# Patient Record
Sex: Female | Born: 1945 | Hispanic: No
Health system: Northeastern US, Academic
[De-identification: ages and names within clinical notes are randomized; demographics above are authoritative.]

---

## 2019-10-03 ENCOUNTER — Ambulatory Visit

## 2019-11-08 ENCOUNTER — Ambulatory Visit: Admit: 2019-11-08 | Payer: Medicare PPO

## 2019-11-08 ENCOUNTER — Ambulatory Visit: Admitting: Internal Medicine

## 2019-11-08 LAB — HX CHEM-PANELS
HX ANION GAP: 6 (ref 3–14)
HX BLOOD UREA NITROGEN: 12 mg/dL (ref 6–24)
HX CHLORIDE (CL): 106 meq/L (ref 98–110)
HX CO2: 26 meq/L (ref 20–30)
HX CREATININE (CR): 0.89 mg/dL (ref 0.57–1.30)
HX GFR, AFRICAN AMERICAN: 74 mL/min/{1.73_m2}
HX GFR, NON-AFRICAN AMERICAN: 64 mL/min/{1.73_m2}
HX GLUCOSE: 128 mg/dL (ref 70–139)
HX POTASSIUM (K): 3.9 meq/L (ref 3.6–5.1)
HX SODIUM (NA): 138 meq/L (ref 135–145)

## 2019-11-08 LAB — HX HEM-ROUTINE
HX BASO #: 0.1 10*3/uL (ref 0.0–0.2)
HX BASO: 1 %
HX EOSIN #: 0.2 10*3/uL (ref 0.0–0.5)
HX EOSIN: 2 %
HX HCT: 41.6 % (ref 32.0–45.0)
HX HGB: 13.5 g/dL (ref 11.0–15.0)
HX IMMATURE GRANULOCYTE#: 0 10*3/uL (ref 0.0–0.1)
HX IMMATURE GRANULOCYTE: 0 %
HX LYMPH #: 2 10*3/uL (ref 1.0–4.0)
HX LYMPH: 26 %
HX MCH: 30.2 pg (ref 26.0–34.0)
HX MCHC: 32.5 g/dL (ref 32.0–36.0)
HX MCV: 93.1 fL (ref 80.0–98.0)
HX MONO #: 0.7 10*3/uL (ref 0.2–0.8)
HX MONO: 9 %
HX MPV: 10.8 fL (ref 9.1–11.7)
HX NEUT #: 4.6 10*3/uL (ref 1.5–7.5)
HX NRBC #: 0 10*3/uL
HX NUCLEATED RBC: 0 %
HX PLT: 271 10*3/uL (ref 150–400)
HX RBC BLOOD COUNT: 4.47 M/uL (ref 3.70–5.00)
HX RDW: 13 % (ref 11.5–14.5)
HX SEG NEUT: 61 %
HX WBC: 7.6 10*3/uL (ref 4.0–11.0)

## 2019-11-08 LAB — HX DIABETES: HX GLUCOSE: 128 mg/dL (ref 70–139)

## 2019-11-08 LAB — HX CHEM-LFT
HX ALANINE AMINOTRANSFERASE (ALT/SGPT): 29 IU/L (ref 0–54)
HX ALKALINE PHOSPHATASE (ALK): 98 IU/L (ref 40–130)
HX ASPARTATE AMINOTRANFERASE (AST/SGOT): 25 IU/L (ref 10–42)
HX BILIRUBIN, TOTAL: 0.6 mg/dL (ref 0.2–1.1)

## 2019-11-08 LAB — HX CHEM-OTHER
HX ALBUMIN: 4 g/dL (ref 3.4–4.8)
HX CALCIUM (CA): 9.1 mg/dL (ref 8.5–10.5)
HX PROTEIN, TOTAL: 6.4 g/dL (ref 6.0–8.3)

## 2019-11-08 LAB — HX COAGULATION
HX INR PT: 1 (ref 0.9–1.3)
HX PROTHROMBIN TIME: 11.3 s (ref 9.7–14.0)

## 2019-11-08 NOTE — Progress Notes (Signed)
 * * *    Brabant, Tylia **DOB:** 05-03-1946 (73 yo F) **Acc No.** [id number] **DOS:**  11/08/2019    ---       [name], [name]**    ------    73 Y old Female, DOB: [birth year], External MRN: [id number]    Account Number: [id number]    [address] [place], [id number]    Home: [phone]    Insurance: [address] [place] [employer]    PCP: Margretta Sidle, DO Referring: Margretta Sidle, DO External Visit  ID: [id number]    Appointment Facility: Pulmonology        * * *    11/08/2019 Progress Notes: Beola Cord, MD **CHN#:** [phone]    ------    ---       **Current Medications**    ---    Taking    * Baclofen 20 MG 20 MG Tablet as directed by mouth     ---    * Metoprolol Succinate 50 MG Capsule ER 24 Hour Sprinkle 1 capsule Orally Once a day    ---    * Omeprazole 20 MG Capsule Delayed Release 1 capsule 30 minutes before morning meal Orally Once a day    ---     Past Medical History    ---      HTN.        ---    GERD.        ---    Chronic musculoskeletal pain.        ---    Lower right leg pain.        ---    Peripheral edema.        ---    SOB.        ---      **Surgical History**    ---      Denies Past Surgical History    ---      **Family History**    ---      Sister: Rheumatoid arthritis.    ---      **Allergies**    ---      N.K.D.A.    ---    Forrestine Him Verified]      **Hospitalization/Major Diagnostic Procedure**    ---      Denies Past Hospitalization    ---      **Review of Systems**    ---     _ADULT Pulmonary_ :    Constitutional Normal, no fever, chills, night sweats. Eyes Normal. HENT  Normal. Respiratory as per HPI, Shortness of breath, Swollen legs or feet.  Cardiovascular Normal, no chest pain, palpitations. Gastrointestinal Normal.  Genitourinary Normal. Musculoskeletal Normal. Neurological Normal.  Psychological Normal. Endocrine Normal. Skin Normal. PH Medication side  effects _n/a_.    rest ROS neg or noncontributory.       **Reason for Appointment**    ---       1\. PULMONARY HYPERTENSION/PFT    ---      **History of Present Illness**    ---     _Pulmonary Hypertension_ :    New referral for ?PHTN    Ms. Ennen is 73Y Female referred to [place] for suspected PHTN. She becomes SOB  walking a street block, climbing a flight of stairs (needs to recover at the  top), carrying things, putting shoes or socks on, and talking on the phone.  Does not become SOB getting dressed and showering. For the last 6 months she  has noticed she  has become more fatigued and her daughter has noticed she has  become less active. Denies DVT/ PE, liver disease. She has taken weight loss  medications (no amphetamines. OTC meds only). Sister has RA (Not twins).    Ms. Learn has OSA (CPAP 5-7 hours nightly for the last couple of months).  Sleeping has gotten better but her ability to be active has not improved. She  is taking HTN medications,    Former smoker 35 years ago (1 pack a day at 56 for 20 years). Experiences  swelling in feet and legs also has some moments of dizziness    Denies DM, CAD, A-fib, HIV, IVDU, cocaine and meth use.    Daughter: [name] Phone number: [phone]    Denies fever, cough or exposure to COVID-19    Denies syncope.    No new or changes in medications    We are going to evaluate her for PHTN with RHC    Data    Apparently has ECHO s/o PHTN (we do not have report - just notation in  records)    I, Angelique Holm, acted as a Neurosurgeon for Dr. Pattricia Boss. Signature  below:    Angelique Holm, Scribe, 11/08/2019 1:30 PM.      **Vital Signs**    ---    Pain scale 0, Ht-in 68, Wt-lbs 218, BMI 33.14, BP 131/74, HR 68, RR 20, Temp  97.6, Oxygen sat % 98RA.      **Examination**    ---     _Pulmonary:_    General Appearance:  alert and oriented x 3, no apparent distress, obese.    Oropharynx:  Normal.    Nasal Mucosa:  Normal.    Nasal Septum:  Normal.    Neck: flat JVP .    Chest Inspection:  Normal.    Breath sounds:  Normal, without wheezes, rhonchi or rales.     Respiratory Effort:  No use of accessory muscles.    Heart Exam:  normal S1/S2 progression, Regular rate and rhythm.    Gastrointestinal:  normal abdomen, nontender.    Extremities:  No cyanosis, no clubbing.    Lymphatic:  No, cervical adenopathy, supraclavicular adenopathy.    Peripheral Vascular System:  Trace edema both legs.    Musculoskeletal:  muscle strength and tone normal.    Skin:  Normal without rash or lesions.    Catheter site:  n/a.         **Assessments**    ---    1\. 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 Essential hypertension - I10    ---    3\. SOB (shortness of breath) - R06.02    ---    4\. 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 Obstructive sleep apnea (adult) (pediatric) - G47.33    ---     SOB (?PHTN): Do not have all the reports/data but assuming RHC were to  demonstrate PHTN - likely etiologies would be diastolic dysfunction (multiple  risk factors) or OSA.    However, needs RHC to differentiate and eliminate other etiologies    Of note, NT-proBNP >1000    Discussed with patient.    ---      **Treatment**    ---      **1\. Pulmonary hypertension**    _LAB: Comp. Metabolic Panel (CMP; 80053)_   Value Reference Range    ---------    Albumin  4.0  3.4 - 4.8 - g/dL    Alkaline Phosphatase (ALK) 98  40 - 130 - IU/L    ------------    Anion Gap 6  3 - 14 -    ------------    Bilirubin, Total 0.6  0.2 - 1.1 - mg/dL    ------------    Calcium (Ca) 9.1  8.5 - 10.5 - mg/dL    ------------    Chloride (CL) 106  98 - 110 - mEq/L    ------------    CO2 26  20 - 30 - mEq/L    ------------    Creatinine (CR) 0.89  0.57 - 1.30 - mg/dL    ------------    Glucose 128  70 - 139 - mg/dL    ------------    Potassium (K) 3.9  3.6 - 5.1 - mEq/L    ------------    Protein, Total 6.4  6.0 - 8.3 - g/dL    ------------    Aspartate Aminotranferase (AST/SGOT) 25  10 - 42 - IU/L     ------------    Alanine Aminotransferase (ALT/SGPT) 29  0 - 54 - IU/L    ------------    Sodium (NA) 138  135 - 145 - mEq/L    ------------    Blood Urea Nitrogen 12  6 - 24 - mg/dL    ------------    _LAB: PT Prothrombin time/INR (PT)_  Value Reference Range    ---------    PT 11.3  9.7 - 14.0 - sec    INR 1.0  0.9 - 1.3 -    ------------    _LAB: CBC/DIFF with PLT (CBCWD)_  Value Reference Range    ---------    WBC 7.6  4.0 - 11.0 - K/uL    RBC 4.47  3.70 - 5.00 - M/uL    ------------    HGB 13.5  11.0 - 15.0 - g/dL    ------------    HCT 41.6  32.0 - 45.0 - %    ------------    MCV 93.1  80.0 - 98.0 - fL    ------------    MCH 30.2  26.0 - 34.0 - pg    ------------    MCHC 32.5  32.0 - 36.0 - g/dL    ------------    RDW 13.0  11.5 - 14.5 - %    ------------    PLT 271  150 - 400 - K/uL    ------------    MPV 10.8  9.1 - 11.7 - fL    ------------    SEG NEUT 61   \- %    ------------    LYMPH 26   \- %    ------------    MONO 9   \- %    ------------    EOS 2   \- %    ------------    BASO 1   \- %    ------------    NEUT # 4.6  1.5 - 7.5 - K/uL    ------------    LYMPH # 2.0  1.0 - 4.0 - K/uL    ------------    MONO # 0.7  0.2 - 0.8 - K/uL    ------------    EOSIN # 0.2  0.0 - 0.5 - K/uL    ------------    BASO # 0.1  0.0 - 0.2 - K/uL    ------------    Imm Grnas 0   \- %    ------------    NRBC  0   \- %    ------------    Imm Grans, Abs 0.0  0.0 - 0.1 - K/uL    ------------    NRBC, Abs 0.0  <0.0 - K/uL    ------------    _LAB: Pro BNP_  Value Reference Range    ---------    Pro BNP 1093 H <125 - pg/mL      **Follow Up**    ---    after RHC    Electronically signed by Simeon Craft on 11/13/2019 at 01:55 PM EST    Sign off status: Completed        * * *        Pulmonology    [address]    [place], [address]     [place], [place] [address]    Tel: [phone]    Fax: [phone]              * * *          Progress Note: Beola Cord, MD 11/08/2019    ---    Note generated by eClinicalWorks EMR/PM Software ([email or web address])

## 2019-11-08 NOTE — Progress Notes (Signed)
.  Progress Notes  .  Patient: Alexis [name]  Provider: Simeon Craft    .  DOB: January 31, 1946 Age: 73 Y Sex: Female  .  PCP: Margretta Sidle DO  Date: 11/08/2019  .  --------------------------------------------------------------------------------  .  REASON FOR APPOINTMENT  .  1. PULMONARY HYPERTENSION/PFT  .  HISTORY OF PRESENT ILLNESS  .  Pulmonary Hypertension:   New referral for ?PHTNMs. Heckart is 36Y Female referred to [place] for  suspected PHTN. She becomes SOB walking a street block, climbing  a flight of stairs (needs to recover at the top), carrying  things, putting shoes or socks on, and talking on the phone. Does  not become SOB getting dressed and showering. For the last 6  months she has noticed she has become more fatigued and her  daughter has noticed she has become less active. Denies DVT/ PE,  liver disease. She has taken weight loss medications (no  amphetamines. OTC meds only). Sister has RA (Not twins). Ms.  Carreno has OSA (CPAP 5-7 hours nightly for the last couple of  months). Sleeping has gotten better but her ability to be active  has not improved. She is taking HTN medications, Former smoker 35  years ago (1 pack a day at 46 for 20 years). Experiences swelling  in feet and legs also has some moments of dizzinessDenies DM,  CAD, A-fib, HIV, IVDU, cocaine and meth use.Daughter: Alexis [name]  Phone number: [phone] fever, cough or exposure to  COVID-19Denies syncope. No new or changes in medicationsWe are  going to evaluate her for PHTN with RHCDataApparently has ECHO  s/o PHTN (we do not have report - just notation in records)I,  Angelique Holm, acted as a Neurosurgeon for Dr. Pattricia Boss.  Signature below: Angelique Holm, Scribe, 11/08/2019  1:30 PM.  .  CURRENT MEDICATIONS  .  Taking Baclofen 20 MG 20 MG Tablet as directed by mouth  Taking Metoprolol Succinate 50 MG Capsule ER 24 Hour Sprinkle 1  capsule Orally Once a day  Taking Omeprazole 20 MG Capsule Delayed Release 1  capsule 30  minutes before morning meal Orally Once a day  .  PAST MEDICAL HISTORY  .  HTN  GERD  Chronic musculoskeletal pain  Lower right leg pain  Peripheral edema  SOB  .  ALLERGIES  .  N.K.D.A.  .  SURGICAL HISTORY  .  Denies Past Surgical History  .  FAMILY HISTORY  .  Sister: Rheumatoid arthritis.  [name]  HOSPITALIZATION/MAJOR DIAGNOSTIC PROCEDURE  .  Denies Past Hospitalization  .  REVIEW OF SYSTEMS  .  ADULT Pulmonary:  .  Constitutional    Normal, no fever, chills, night sweats . Eyes     Normal . HENT    Normal . Respiratory    as per HPI, Shortness  of breath, Swollen legs or feet . Cardiovascular    Normal, no  chest pain, palpitations . Gastrointestinal    Normal .  Genitourinary    Normal . Musculoskeletal    Normal .  Neurological    Normal . Psychological    Normal . Endocrine     Normal . Skin    Normal . PH Medication side effects    n/a .  [name]  rest ROS neg or noncontributory.  [name]  VITAL SIGNS  .  Pain scale 0, Ht-in 68, Wt-lbs 218, BMI 33.14, BP 131/74, HR 68,  RR 20, Temp 97.6, Oxygen sat % 98RA.  [name]  EXAMINATION  .  Pulmonary:  General Appearance: alert and oriented x 3, no apparent distress,  obese.  .  Oropharynx: Normal.  .  Nasal Mucosa: Normal.  .  Nasal Septum: Normal.  .  Neck:flat JVP .  [name]  Chest Inspection: Normal.  .  Breath sounds: Normal, without wheezes, rhonchi or rales.  [name]  Respiratory Effort: No use of accessory muscles.  .  Heart Exam: normal S1/S2 progression, Regular rate and rhythm.  .  Gastrointestinal: normal abdomen, nontender.  .  Extremities: No cyanosis, no clubbing.  [name]  Lymphatic: No, cervical adenopathy, supraclavicular adenopathy.  .  Peripheral Vascular System: Trace edema both legs.  .  Musculoskeletal: muscle strength and tone normal.  .  Skin: Normal without rash or lesions.  .  Catheter site: n/a.  .  ASSESSMENTS  .  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  Essential hypertension - I10  .  SOB (shortness of breath) - R06.02  .  Dependence on other enabling  machines and devices - Z99.89  .  Obstructive sleep apnea (adult) (pediatric) - G47.33  .  SOB (?PHTN): Do not have all the reports/data but assuming RHC  were to demonstrate PHTN - likely etiologies would be diastolic  dysfunction (multiple risk factors) or OSA.However, needs RHC to  differentiate and eliminate other etiologiesOf note, NT-proBNP  >1000Discussed with patient.  .  TREATMENT  .  Pulmonary hypertension  LAB: Comp. Metabolic Panel (CMP; 80053)  Albumin     4.0     (3.4 - 4.8 - g/dL)  Alkaline Phosphatase (ALK)     98     (40 - 130 - IU/L)  Anion Gap     6     (3 - 14 - )  Bilirubin, Total     0.6     (0.2 - 1.1 - mg/dL)  Calcium (Ca)     9.1     (8.5 - 10.5 - mg/dL)  Chloride (CL)     440     (98 - 110 - mEq/L)  CO2     26     (20 - 30 - mEq/L)  Creatinine (CR)     0.89     (0.57 - 1.30 - mg/dL)  Glucose     102     (70 - 139 - mg/dL)  Potassium (K)     3.9     (3.6 - 5.1 - mEq/L)  Protein, Total     6.4     (6.0 - 8.3 - g/dL)  Aspartate Aminotranferase (AST/SGOT)     25     (10 - 42 - IU/L)  Alanine Aminotransferase (ALT/SGPT)     29     (0 - 54 - IU/L)  Sodium (NA)     138     (135 - 145 - mEq/L)  Blood Urea Nitrogen     12     (6 - 24 - mg/dL)  .  [name]  LAB: PT Prothrombin time/INR (PT)  PT     11.3     (9.7 - 14.0 - sec)  INR     1.0     (0.9 - 1.3 - )  .  [name]  LAB: CBC/DIFF with PLT (CBCWD)  WBC     7.6     (4.0 - 11.0 - K/uL)  RBC     4.47     (3.70 - 5.00 - M/uL)  HGB     13.5     (11.0 -  15.0 - g/dL)  HCT     77.9     (39.6 - 45.0 - %)  MCV     93.1     (80.0 - 98.0 - fL)  MCH     30.2     (26.0 - 34.0 - pg)  MCHC     32.5     (32.0 - 36.0 - g/dL)  RDW     88.6     (48.4 - 14.5 - %)  PLT     271     (150 - 400 - K/uL)  MPV     10.8     (9.1 - 11.7 - fL)  SEG NEUT     61     ( - %)  LYMPH     26     ( - %)  MONO     9     ( - %)  EOS     2     ( - %)  BASO     1     ( - %)  NEUT #     4.6     (1.5 - 7.5 - K/uL)  LYMPH #     2.0     (1.0 - 4.0 - K/uL)  MONO #     0.7     (0.2 - 0.8 - K/uL)  EOSIN #      0.2     (0.0 - 0.5 - K/uL)  BASO #     0.1     (0.0 - 0.2 - K/uL)  Imm Grnas     0     ( - %)  NRBC     0     ( - %)  Imm Grans, Abs     0.0     (0.0 - 0.1 - K/uL)  NRBC, Abs     0.0     (<0.0 - K/uL)  .  [name]  LAB: Pro BNP  Pro BNP     1093     (<125 - pg/mL)  .  FOLLOW UP  .  after RHC  .  Electronically signed by Simeon Craft on  11/13/2019 at 01:55 PM EST  .  Document electronically signed by Simeon Craft    .

## 2019-11-09 LAB — HX CHEM-ENZ-FRAC: HX PRO BNP: 1093 pg/mL — ABNORMAL HIGH

## 2019-11-13 LAB — HX ASTHMA - PFTS

## 2019-11-30 ENCOUNTER — Ambulatory Visit: Admit: 2019-11-30 | Payer: Medicare PPO

## 2019-11-30 ENCOUNTER — Ambulatory Visit: Admitting: Internal Medicine

## 2019-11-30 LAB — HX CHEM-ENZ-FRAC: HX B NATRIURETIC PEPTIDE (BNP): 82 pg/mL (ref 0–100)

## 2019-11-30 MED ORDER — Furosemide: 20 | 30 | Freq: Every day | ORAL | 11 refills | 0 days | Status: AC

## 2019-11-30 NOTE — Progress Notes (Signed)
* * *      [name], Alexis **DOB:** Jan 24, 1946 (73 yo F) **Acc No.** [id number] **DOS:**  11/30/2019    ---       Alexis [name]**    ------    6 Y old Female, DOB: [birth year]    [address] [place], [place] [address]    Home: [phone]    Provider: Simeon Craft        * * *    Telephone Encounter    ---    Answered by  Deliah Goody Date: 11/30/2019       Time: 02:01 PM    Action Taken                     HON,STEPHANIE  11/30/2019 2:01:45 PM > RHC done showed mild PH (mPA 27) with normal PVR and PCWP 15, which increased to 18 with fluid challenge, suggestive of PH 2/2 diastolic heart failure. Plan to start patient on Lasix 20mg  PO daily, prescription sent. Counseled regarding weight loss. She plans to see a dietician and increase exercise. Cherel, can you please schedule her for 6 month follow-up with Dr. Pattricia Boss?? Thanks!      Lockhart,Cherel  11/30/2019 2:28:23 PM > All set for June 14th/mailed. Cherel        ------            Refills Start Furosemide Tablet, 20 MG, Orally, 30, 1 tablet, Once a day, 30  day(s), Refills=11    ------          * * *                ---          * * *         Provider: Simeon Craft 11/30/2019    ---    Note generated by eClinicalWorks EMR/PM Software ([email or web address])

## 2020-07-10 ENCOUNTER — Ambulatory Visit: Admit: 2020-07-10 | Payer: Medicare PPO

## 2020-07-10 ENCOUNTER — Ambulatory Visit: Admitting: Internal Medicine

## 2020-07-10 NOTE — Progress Notes (Signed)
.  Progress Notes  .  Patient: Alexis [name]  Provider: Simeon Craft    .  DOB: 01-06-1946 Age: 74 Y Sex: Female  .  PCP: Margretta Sidle DO  Date: 07/10/2020  .  --------------------------------------------------------------------------------  .  HISTORY OF PRESENT ILLNESS  .  Pulmonary Hypertension:   New referral for ?PHTN (es=dema/abnormal ECHO)(11/08/19) Ms.  [name] is 53Y Female referred to [place] for DOE/SOB (suspected PHTN).  She becomes SOB walking a street block, climbing a flight of  stairs (needs to recover at the top), carrying things, putting  shoes or socks on, and talking on the phone. No SOB getting  dressed or showering. For the last 6 months she has noticed she  has become more fatigued and her daughter has noticed she has  become less active. Denies DVT/ PE, liver disease. +weight loss  medications (no amphetamines. OTC meds only). Sister has RA (Not  twins). +OSA (CPAP 5-7 h nightly for a few months). Sleeping  better but level of activity has not improved. +HTN medications,  Denies DM, CAD, A-fib, HIV, IVDU, cocaine and meth use.Former  smoker quit 35y ago (1 ppd for 16 -20 years). Experiences  swelling in feet and legs also has some moments of  dizzinessDaughter: Alexis [name] Phone number: [phone]  fever, cough or exposure to COVID-19Denies syncope. No new or  changes in medicationsWe are going to evaluate her for PHTN with  RHCDataApparently has ECHO s/o PHTN (we do not have report - just  notation in records)Since last visit, underwent [facility] (12/20):  RA 14/11 (9); RV 39/5, 12; PAP 42/18 (27); PCWP 18/18 (15); CO/CI  (Fick) 5.94/2.96; CO/CI (TD) 4.90/2.44; PVR 161/195. PCWP  increased with fluid challenge (in sum, mild PHTN a/w diastolic  dysfunction (risks: age, sex, HTN, weight)(07/10/20), Alexis [name]  reports no change in respiratory status, in ability to perform  ADLs and exertion. No change in level of activity. Gained weight  during vacation to [place] (205lbs). Before trip to  [place], she  lost a lot of weight and noticed improved respiratory status and  energy. She plans on losing 30lbs.Denies fever, new cough or  exposure related to COVID (vaccinated-Pfizer, no SE).Has trace  edema, denies pre-syncope and syncopeMedications  verified.Functional Class II I, [name], acted as a Neurosurgeon  for Dr. Pattricia Boss. Signature below: [name], Scribe, 07/10/2020  10:04 AM.  .  CURRENT MEDICATIONS  .  Taking Baclofen 20 MG 20 MG Tablet as directed by mouth  Taking Furosemide 20 MG Tablet 1 tablet Orally Once a day  Taking Metoprolol Succinate 50 MG Capsule ER 24 Hour Sprinkle 1  capsule Orally Once a day  Taking Omeprazole 20 MG Capsule Delayed Release 1 capsule 30  minutes before morning meal Orally Once a day  Medication List reviewed and reconciled with the patient  .  PAST MEDICAL HISTORY  .  HTN  GERD  Chronic musculoskeletal pain  Lower right leg pain  Peripheral edema  SOB  .  ALLERGIES  .  yes[Allergies Verified]  .  SURGICAL HISTORY  .  No Surgical History documented.  [name]  FAMILY HISTORY  .  Sister: Rheumatoid arthritis.  [name]  HOSPITALIZATION/MAJOR DIAGNOSTIC PROCEDURE  .  No Hospitalization History.  [name]  REVIEW OF SYSTEMS  .  ADULT Pulmonary:  .  Constitutional    Normal, no fever, chills, night sweats . Eyes     Normal . HENT    Normal . Respiratory    as per HPI, Shortness  of breath, Swollen legs or feet . Cardiovascular    Normal, no  chest pain, palpitations . Gastrointestinal    Normal .  Genitourinary    Normal . Musculoskeletal    Normal .  Neurological    Normal . Psychological    Normal . Endocrine     Normal . Skin    Normal . PH Medication side effects    n/a .  [name]  stable ROS (respiratory status) since last visit (but had  improved with weight loss)rest ROS neg.  [name]  VITAL SIGNS  .  Pain scale 0, Ht-in 68, Wt-lbs 205, BMI 31.17, BP 121/84, HR 68,  RR 16, Temp 97.4, Oxygen sat % 97RA.  [name]  EXAMINATION  .  Pulmonary:  General Appearance: alert and oriented x 3, no apparent  distress,  obese.  .  Oropharynx: Normal.  .  Nasal Mucosa: Normal.  .  Nasal Septum: Normal.  .  Neck:flat JVP .  [name]  Chest Inspection: Normal.  .  Breath sounds: Normal, without wheezes, rhonchi or rales.  [name]  Respiratory Effort: No use of accessory muscles.  .  Heart Exam: normal S1/S2 progression, Regular rate and rhythm.  .  Gastrointestinal: normal abdomen, nontender.  .  Extremities: No cyanosis, no clubbing.  [name]  Lymphatic: No, cervical adenopathy, supraclavicular adenopathy.  .  Peripheral Vascular System: Trace edema both legs.  .  Musculoskeletal: muscle strength and tone normal.  .  Skin: Normal without rash or lesions.  .  Catheter site: n/a.  .  ASSESSMENTS  .  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  Essential hypertension - I10  .  Obstructive sleep apnea (adult) (pediatric) - G47.33  .  Diastolic dysfunction - I51.89  .  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 symptoms improved with weight loss but regained  weight during vacation. Plans to lose 30lbsWith control of risks  for diastolic dysfunction (especially weight) should  improve.Discussed at length with patientVisit .  .  FOLLOW UP  .  6 Months  .  Electronically signed by Simeon Craft on  07/21/2020 at 03:40 PM EDT  .  Document electronically signed by Simeon Craft    .

## 2020-07-10 NOTE — Progress Notes (Signed)
 * * *    Alexis [name], Alexis [name] **DOB:** May 01, 1946 (74 yo F) **Acc No.** [id number] **DOS:**  07/10/2020    ---       Alexis [name], Alexis [name]**    ------    79 Y old Female, DOB: Jun 25, 1946, External MRN: [id number]    Account Number: [id number]    [address] [place], [id number]    Home: [phone]    Insurance: [address] [place] PPO    PCP: Margretta Sidle, DO Referring: Margretta Sidle, DO External Visit  ID: [id number]    Appointment Facility: Pulmonology        * * *    07/10/2020 Progress Notes: Beola Cord, MD **CHN#:** [id number]    ------    ---       **Current Medications**    ---    Taking    * Baclofen 20 MG 20 MG Tablet as directed by mouth     ---    * Furosemide 20 MG Tablet 1 tablet Orally Once a day    ---    * Metoprolol Succinate 50 MG Capsule ER 24 Hour Sprinkle 1 capsule Orally Once a day    ---    * Omeprazole 20 MG Capsule Delayed Release 1 capsule 30 minutes before morning meal Orally Once a day    ---    Medication List reviewed and reconciled with the patient    ---     Past Medical History    ---      HTN.        ---    GERD.        ---    Chronic musculoskeletal pain.        ---    Lower right leg pain.        ---    Peripheral edema.        ---    SOB.        ---      **Surgical History**    ---      No Surgical History documented.    ---      **Family History**    ---      Sister: Rheumatoid arthritis.    ---      **Hospitalization/Major Diagnostic Procedure**    ---      No Hospitalization History.    ---      **Review of Systems**    ---     _ADULT Pulmonary_ :    Constitutional Normal, no fever, chills, night sweats. Eyes Normal. HENT  Normal. Respiratory as per HPI, Shortness of breath, Swollen legs or feet.  Cardiovascular Normal, no chest pain, palpitations. Gastrointestinal Normal.  Genitourinary Normal. Musculoskeletal Normal. Neurological Normal.  Psychological Normal. Endocrine Normal. Skin Normal. PH Medication side  effects _n/a_.    stable ROS  (respiratory status) since last visit (but had improved with weight  loss)    rest ROS neg.       **History of Present Illness**    ---     _Pulmonary Hypertension_ :    New referral for ?PHTN (es=dema/abnormal ECHO)    (11/08/19) Alexis [name] is 22Y Female referred to [place] for DOE/SOB (suspected  PHTN).    She becomes SOB walking a street block, climbing a flight of stairs (needs to  recover at the top), carrying things, putting shoes or socks on, and talking  on the phone. No SOB getting dressed or showering. For the last 6 months she  has noticed she has become more fatigued and her daughter has noticed she has  become less active.    Denies DVT/ PE, liver disease. +weight loss medications (no amphetamines. OTC  meds only). Sister has RA (Not twins).    +OSA (CPAP 5-7 h nightly for a few months). Sleeping better but level of  activity has not improved.    +HTN medications,    Denies DM, CAD, A-fib, HIV, IVDU, cocaine and meth use.    Former smoker quit 35y ago (1 ppd for 16 -20 years).    Experiences swelling in feet and legs also has some moments of dizziness    Daughter: Alexis [name] Phone number: [phone]    Denies fever, cough or exposure to COVID-19    Denies syncope.    No new or changes in medications    We are going to evaluate her for PHTN with RHC    Data    Apparently has ECHO s/o PHTN (we do not have report - just notation in  records)    Since last visit, underwent RHC    RHC (12/20): RA 14/11 (9); RV 39/5, 12; PAP 42/18 (27); PCWP 18/18 (15); CO/CI  (Fick) 5.94/2.96; CO/CI (TD) 4.90/2.44; PVR 161/195. PCWP increased with fluid  challenge (in sum, mild PHTN a/w diastolic dysfunction (risks: age, sex, HTN,  weight)    (07/10/20), [name] reports no change in respiratory status, in ability to  perform ADLs and exertion. No change in level of activity. Gained weight  during vacation to [place] (205lbs). Before trip to [place], she lost a lot of  weight and noticed improved respiratory status and energy.  She plans on losing  30lbs.    Denies fever, new cough or exposure related to COVID (vaccinated-Pfizer, no  SE).    Has trace edema, denies pre-syncope and syncope    Medications verified.    Functional Class II    I, Alexis [name], acted as a Neurosurgeon for Dr. Pattricia Boss. Signature below:    Alexis [name], Scribe, 07/10/2020 10:04 AM.      **Vital Signs**    ---    Pain scale 0, Ht-in 68, Wt-lbs 205, BMI 31.17, BP 121/84, HR 68, RR 16, Temp  97.4, Oxygen sat % 97RA.      **Examination**    ---     _Pulmonary:_    General Appearance:  alert and oriented x 3, no apparent distress, obese.    Oropharynx:  Normal.    Nasal Mucosa:  Normal.    Nasal Septum:  Normal.    Neck: flat JVP .    Chest Inspection:  Normal.    Breath sounds:  Normal, without wheezes, rhonchi or rales.    Respiratory Effort:  No use of accessory muscles.    Heart Exam:  normal S1/S2 progression, Regular rate and rhythm.    Gastrointestinal:  normal abdomen, nontender.    Extremities:  No cyanosis, no clubbing.    Lymphatic:  No, cervical adenopathy, supraclavicular adenopathy.    Peripheral Vascular System:  Trace edema both legs.    Musculoskeletal:  muscle strength and tone normal.    Skin:  Normal without rash or lesions.    Catheter site:  n/a.         **Assessments**    ---    1\. 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 Essential hypertension - I10    ---    3\. Obstructive sleep apnea (adult) (pediatric) - G47.33    ---  4\. 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 symptoms improved with weight loss but regained weight during  vacation. Plans to lose 30lbs    With control of risks for diastolic dysfunction (especially weight) should  improve.    Discussed at length with patient    Visit .    ---      **Follow Up**    ---    6 Months    Electronically signed by Simeon Craft on 07/21/2020 at 03:40 PM EDT    Sign off status: Completed        *  * *        Pulmonology    [address]    [place], [address]    [place], [place] [address]    Tel: [phone]    Fax: [phone]              * * *          Progress Note: Beola Cord, MD 07/10/2020    ---    Note generated by eClinicalWorks EMR/PM Software ([email or web address])

## 2020-07-21 LAB — HX ASTHMA - PFTS

## 2021-01-07 ENCOUNTER — Ambulatory Visit: Admit: 2021-01-07 | Payer: Medicare PPO

## 2021-01-07 ENCOUNTER — Ambulatory Visit (HOSPITAL_BASED_OUTPATIENT_CLINIC_OR_DEPARTMENT_OTHER): Admitting: Psychiatry

## 2021-01-07 ENCOUNTER — Ambulatory Visit: Admitting: Internal Medicine

## 2021-01-07 NOTE — Progress Notes (Signed)
.  Progress Notes  .  Patient: Alexis [name]  Provider: Simeon Craft    .  DOB: Oct 25, 1946 Age: 75 Y Sex: Female  .  PCP: Margretta Sidle DO  Date: 01/07/2021  .  --------------------------------------------------------------------------------  .  REASON FOR APPOINTMENT  .  1. IN PERSON  .  HISTORY OF PRESENT ILLNESS  .  Pulmonary Hypertension:   New referral for ?PHTN (edema/abnormal ECHO)(11/08/19) Alexis [name]  is 83Y Female referred to [place] for DOE/SOB (suspected PHTN). She  becomes SOB walking a street block, climbing a flight of stairs  (needs to recover at the top), carrying things, putting shoes or  socks on, and talking on the phone. No SOB getting dressed or  showering. For the last 6 months she has noticed she has become  more fatigued and her daughter has noticed she has become less  active. Denies DVT/ PE, liver disease. +weight loss medications  (no amphetamines. OTC meds only). Sister has RA (Not twins). +OSA  (CPAP 5-7 h nightly for a few months). Sleeping better but level  of activity has not improved. +HTN medications, Denies DM, CAD,  A-fib, HIV, IVDU, cocaine and meth use.Former smoker quit 35y ago  (1 ppd for 16 -20 years). Experiences swelling in feet and legs  also has some moments of dizzinessDaughter: Alexis [name] Phone  number: [phone] fever, cough or exposure to  COVID-19Denies syncope. No new or changes in medicationsWe are  going to evaluate her for PHTN with RHCDataApparently has ECHO  s/o PHTN (we do not have report - just notation in records)Since  last visit, underwent [facility] (12/20): RA 14/11 (9); RV 39/5, 12;  PAP 42/18 (27); PCWP 18/18 (15); CO/CI (Fick) 5.94/2.96; CO/CI  (TD) 4.90/2.44; PVR 161/195. PCWP increased with fluid challenge  (in sum, mild PHTN a/w diastolic dysfunction (risks: age, sex,  HTN, weight)(07/10/20), [name] reports no change in respiratory  status, in ability to perform ADLs and exertion. No change in  level of activity. Gained weight during  vacation to [place]  (205lbs). Before trip to [place], she lost a lot of weight and  noticed improved respiratory status and energy. She plans on  losing 30lbs.Denies fever, new cough or exposure related to COVID  (vaccinated-Pfizer, no SE).Has trace edema, denies pre-syncope  and syncopeMedications verified.Functional Class II(01/07/21), Ms.  Schuman reports no change in respiratory status, in ability to  perform ADLs and exertion. No change in level of activity. Having  difficulty losing weight and has not been very active. Denies  fever, new cough or exposure related to COVID  (vaccinated-Pfizer/booster 12/21); + flu shot).Has edema, denies  pre-syncope and syncopeMedications verified.Functional Class II  I, Alexis [name], acted as a Neurosurgeon for Dr. Pattricia Boss. Signature  below: Alexis [name], Scribe, 01/07/2021 10:23 AM.  .  CURRENT MEDICATIONS  .  Taking Baclofen 20 MG 20 MG Tablet as directed by mouth  Taking Furosemide 20 MG Tablet 1 tablet Orally Once a day  Taking Metoprolol Succinate 50 MG Capsule ER 24 Hour Sprinkle 1  capsule Orally Once a day  Taking Omeprazole 20 MG Capsule Delayed Release 1 capsule 30  minutes before morning meal Orally Once a day  Medication List reviewed and reconciled with the patient  .  PAST MEDICAL HISTORY  .  HTN  GERD  Chronic musculoskeletal pain  Lower right leg pain  Peripheral edema  SOB  .  ALLERGIES  .  yes[Allergies Verified]  .  SURGICAL HISTORY  .  No Surgical History documented.  [name]  FAMILY HISTORY  .  Sister: Rheumatoid arthritisSister: Cancer Malignancy.  .  HOSPITALIZATION/MAJOR DIAGNOSTIC PROCEDURE  .  No Hospitalization History.  [name]  REVIEW OF SYSTEMS  .  ADULT Pulmonary:  .  Constitutional    Normal, no fever, chills, night sweats . Eyes     Normal . HENT    Normal . Respiratory    as per HPI, Shortness  of breath, Swollen legs or feet . Cardiovascular    Normal, no  chest pain, palpitations . Gastrointestinal    Normal .  Genitourinary    Normal . Musculoskeletal    Normal  .  Neurological    Normal . Psychological    Normal . Endocrine     Normal . Skin    Normal . PH Medication side effects    n/a .  [name]  stable ROS (respiratory status) since last visit rest ROS neg.  [name]  VITAL SIGNS  .  Pain scale 0, Ht-in 68, Wt-lbs 204, BMI 31.01, BP 164/81 R,  162/78 L, 156/80 L, HR 72, RR 18, Temp 98.2, Oxygen sat % 98RA.  [name]  EXAMINATION  .  Pulmonary:  General Appearance: alert and oriented x 3, no apparent distress,  obese.  .  Oropharynx: Normal.  .  Nasal Mucosa: Normal.  .  Nasal Septum: Normal.  .  Neck:flat JVP .  [name]  Chest Inspection: Normal.  .  Breath sounds: Normal, without wheezes, rhonchi or rales.  [name]  Respiratory Effort: No use of accessory muscles.  .  Heart Exam: Regular rate and rhythm, normal S1/S2 progression,.  .  Gastrointestinal: normal abdomen, nontender.  .  Extremities: No cyanosis, no clubbing.  [name]  Lymphatic: No, cervical adenopathy, supraclavicular adenopathy.  .  Peripheral Vascular System: Trace edema both legs.  .  Musculoskeletal: muscle strength and tone normal.  .  Skin: Normal without rash or lesions.  .  Catheter site: n/a.  .  ASSESSMENTS  .  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  Essential hypertension - I10  .  Obstructive sleep apnea (adult) (pediatric) - G47.33  .  Diastolic dysfunction - I51.89  .  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 symptoms improved with weight loss but regained  weight during vacation. Had planned to lose 30lbs, but has not  lost weight so far - also not doing much (exertion/exercise)With  control of risks for diastolic dysfunction (especially weight)  should improve.Discussed at length with patient - will have her  seen by Oran Rein Also needs better control of BPVisit .  .  FOLLOW UP  .  Refer her to Oran Rein (for televist ASAP), next appointment  with [place] 6 Months  .  Electronically signed by Simeon Craft on  01/12/2021 at 01:12 PM EST  .  Document electronically signed  by Simeon Craft    .

## 2021-01-07 NOTE — Progress Notes (Signed)
* * *    [name], Alexis **DOB:** 1946-10-03 (75 yo F) **Acc No.** [id number] **DOS:**  01/07/2021    ---        Alexis [name], Alexis [name]**    ------    49 Y old Female, DOB: [birth year], External MRN: [id number]    Account Number: [id number]    [address] [place], [place]    Home: [phone]    Insurance: [address] [place] PPO    PCP: Alexis Sidle, DO Referring: Alexis Sidle, DO External Visit  ID: [id number]    Appointment Facility: Pulmonology        * * *    01/07/2021    Progress Notes: Alexis Cord, MD **CHN#:** [id number]    ------    ---        **Current Medications**    ---    Taking      * Baclofen 20 MG 20 MG Tablet as directed by mouth     ---    * Furosemide 20 MG Tablet 1 tablet Orally Once a day     ---    * Metoprolol Succinate 50 MG Capsule ER 24 Hour Sprinkle 1 capsule Orally Once a day     ---    * Omeprazole 20 MG Capsule Delayed Release 1 capsule 30 minutes before morning meal Orally Once a day     ---    Medication List reviewed and reconciled with the patient    ---      Past Medical History    ---      HTN.        ---    GERD.        ---    Chronic musculoskeletal pain.        ---    Lower right leg pain.        ---    Peripheral edema.        ---    SOB.        ---      **Surgical History**    ---      No Surgical History documented.    ---      **Family History**    ---      Sister: Rheumatoid arthritis    Sister: Cancer Malignancy.    ---      **Hospitalization/Major Diagnostic Procedure**    ---      No Hospitalization History.    ---      **Review of Systems**    ---    _ADULT Pulmonary_ :    Constitutional  Normal  ,  no fever, chills, night sweats  . Eyes  Normal  .  HENT  Normal  . Respiratory  as per HPI  ,  Shortness of breath  ,  Swollen  legs or feet  . Cardiovascular  Normal  ,  no chest pain, palpitations  .  Gastrointestinal  Normal  . Genitourinary  Normal  . Musculoskeletal  Normal  . Neurological  Normal  . Psychological  Normal  . Endocrine   Normal  . Skin  Normal  . PH Medication side effects  _ n/a  _ .    stable ROS (respiratory status) since last visit    rest ROS neg.        **Reason for Appointment**    ---      1\. IN PERSON    ---      **History of Present  Illness**    ---    _Pulmonary Hypertension_ :    New referral for ?PHTN (edema/abnormal ECHO)    (11/08/19) Alexis [name] is 79Y Female referred to [place] for DOE/SOB (suspected  PHTN).    She becomes SOB walking a street block, climbing a flight of stairs (needs to  recover at the top), carrying things, putting shoes or socks on, and talking  on the phone. No SOB getting dressed or showering. For the last 6 months she  has noticed she has become more fatigued and her daughter has noticed she has  become less active.    Denies DVT/ PE, liver disease. +weight loss medications (no amphetamines. OTC  meds only). Sister has RA (Not twins).    +OSA (CPAP 5-7 h nightly for a few months). Sleeping better but level of  activity has not improved.    +HTN medications,    Denies DM, CAD, A-fib, HIV, IVDU, cocaine and meth use.    Former smoker quit 35y ago (1 ppd for 16 -20 years).    Experiences swelling in feet and legs also has some moments of dizziness    Daughter: Alexis [name] Phone number: [phone]    Denies fever, cough or exposure to COVID-19    Denies syncope.    No new or changes in medications    We are going to evaluate her for PHTN with RHC    Data    Apparently has ECHO s/o PHTN (we do not have report - just notation in  records)    Since last visit, underwent RHC    RHC (12/20): RA 14/11 (9); RV 39/5, 12; PAP 42/18 (27); PCWP 18/18 (15); CO/CI  (Fick) 5.94/2.96; CO/CI (TD) 4.90/2.44; PVR 161/195. PCWP increased with fluid  challenge (in sum, mild PHTN a/w diastolic dysfunction (risks: age, sex, HTN,  weight)    (07/10/20), Alexis [name] reports no change in respiratory status, in ability to  perform ADLs and exertion. No change in level of activity. Gained weight  during vacation to [place]  (205lbs). Before trip to [place], she lost a lot of  weight and noticed improved respiratory status and energy. She plans on losing  30lbs.    Denies fever, new cough or exposure related to COVID (vaccinated-Pfizer, no  SE).    Has trace edema, denies pre-syncope and syncope    Medications verified.    Functional Class II    (01/07/21), Alexis [name] reports no change in respiratory status, in ability to  perform ADLs and exertion. No change in level of activity. Having difficulty  losing weight and has not been very active.    Denies fever, new cough or exposure related to COVID (vaccinated-  Pfizer/booster 12/21); + flu shot).    Has edema, denies pre-syncope and syncope    Medications verified.    Functional Class II    I, Alexis [name], acted as a Neurosurgeon for Dr. Pattricia Boss. Signature below:    Alexis [name], Scribe, 01/07/2021 10:23 AM.      **Vital Signs**    ---    Pain scale **0** , Ht-in 68, Wt-lbs **204** , BMI  **31.01** , BP  164/81 R  ,  162/78 L  ,  156/80 L  , HR **72** , RR **18** , Temp **98.2** , Oxygen sat %  **98RA** .      **Examination**    ---    _Pulmonary:_    General Appearance:  alert and oriented x 3  ,  no apparent distress  ,  obese  .    Oropharynx:  Normal  .    Nasal Mucosa:  Normal  .    Nasal Septum:  Normal  .    Neck:  flat JVP  .    Chest Inspection:  Normal  .    Breath sounds:  Normal  ,  without wheezes, rhonchi or rales  .    Respiratory Effort:  No use of accessory muscles  .    Heart Exam:  Regular rate and rhythm,  normal S1/S2 progression  ,  .    Gastrointestinal:  normal abdomen  ,  nontender  .    Extremities:  No cyanosis, no clubbing  .    Lymphatic:  No  ,  cervical adenopathy  ,  supraclavicular adenopathy  .    Peripheral Vascular System:  Trace edema both legs  .    Musculoskeletal:  muscle strength and tone normal  .    Skin:  Normal without rash or lesions  .    Catheter site:  n/a  .          **Assessments**    ---    1\. 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 Essential hypertension - I10    ---    3\. Obstructive sleep apnea (adult) (pediatric) - G47.33    ---    4\. 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 symptoms improved with weight loss but regained weight during  vacation. Had planned to lose 30lbs, but has not lost weight so far - also not  doing much (exertion/exercise)    With control of risks for diastolic dysfunction (especially weight) should  improve.    Discussed at length with patient - will have her seen by Oran Rein    Also needs better control of BP    Visit .    ---      **Follow Up**    ---    Refer her to Oran Rein (for televist ASAP), next appointment with [place] 6  Months    Electronically signed by Simeon Craft on 01/12/2021 at 01:12 PM EST    Sign off status: Completed        * * *        Pulmonology    [address]    [place], [address]    [place], [place] [address]    Tel: [phone]    Fax: [phone]              * * *          Progress Note: Alexis Cord, MD 01/07/2021    ---    Note generated by eClinicalWorks EMR/PM Software ([email or web address])

## 2021-01-12 LAB — HX ASTHMA - PFTS

## 2021-05-27 ENCOUNTER — Encounter (INDEPENDENT_AMBULATORY_CARE_PROVIDER_SITE_OTHER)

## 2021-05-27 ENCOUNTER — Encounter (HOSPITAL_BASED_OUTPATIENT_CLINIC_OR_DEPARTMENT_OTHER): Admitting: Internal Medicine

## 2021-05-28 ENCOUNTER — Ambulatory Visit (HOSPITAL_BASED_OUTPATIENT_CLINIC_OR_DEPARTMENT_OTHER): Admitting: Internal Medicine

## 2021-05-28 ENCOUNTER — Encounter

## 2021-06-04 ENCOUNTER — Encounter (HOSPITAL_BASED_OUTPATIENT_CLINIC_OR_DEPARTMENT_OTHER)

## 2021-06-04 ENCOUNTER — Other Ambulatory Visit

## 2021-06-04 ENCOUNTER — Ambulatory Visit: Admit: 2021-06-04 | Discharge: 2021-06-04 | Payer: PRIVATE HEALTH INSURANCE | Attending: Internal Medicine

## 2021-06-04 VITALS — BP 162/77

## 2021-06-04 NOTE — Progress Notes (Signed)
[facility]  [facility]  [address]  [place] [address]  Dept: [phone]    History of Present Illness:  New referral for ?PHTN (edema/abnormal ECHO)  (11/08/19) Ms. Cortner is 62Y Female referred to [place] for DOE/SOB (suspected PHTN).   She becomes SOB walking a street block, climbing a flight of stairs (needs to recover at the top), carrying things, putting shoes or socks on, and talking on the phone. No SOB getting dressed or showering. For the last 6 months she has noticed she has become more fatigued and her daughter has noticed she has become less active.   Denies DVT/ PE, liver disease. +weight loss medications (no amphetamines. OTC meds only). Sister has RA (Not twins).   +OSA (CPAP 5-7 h nightly for a few months). Sleeping better but level of activity has not improved.   +HTN medications,   Denies DM, CAD, A-fib, HIV, IVDU, cocaine and meth use.  Former smoker quit 35y ago (1 ppd for 16 -20 years).   Experiences swelling in feet and legs also has some moments of dizziness  [name] number: [phone]  Denies fever, cough or exposure to COVID-19  Denies syncope.   No new or changes in medications  We are going to evaluate her for PHTN with RHC  Data  Apparently has ECHO s/o PHTN (we do not have report - just notation in records)    Since last visit, underwent RHC  RHC (12/20): RA 14/11 (9); RV 39/5, 12; PAP 42/18 (27); PCWP 18/18 (15); CO/CI (Fick) 5.94/2.96; CO/CI (TD) 4.90/2.44; PVR 161/195. PCWP increased with fluid challenge (in sum, mild PHTN a/w diastolic dysfunction (risks: age, sex, HTN, weight)    (07/10/20), Ms. Mealy reports no change in respiratory status, in ability to perform ADLs and exertion. No change in level of activity. Gained weight during vacation to [place] (205lbs). Before trip to [place], she lost a lot of weight and noticed improved respiratory status and energy. She plans on losing 30lbs.  Denies fever, new cough or  exposure related to COVID (vaccinated-Pfizer, no SE).  Has trace edema, denies pre-syncope and syncope  Medications verified.  Functional Class II    (01/07/21), Ms. Lightcap reports no change in respiratory status, in ability to perform ADLs and exertion. No change in level of activity. Having difficulty losing weight and has not been very active.   Denies fever, new cough or exposure related to COVID (vaccinated-Pfizer/booster 12/21); + flu shot).  Has edema, denies pre-syncope and syncope  Medications verified.  Functional Class II    (06/04/2021) Ms. Pedigo reports no change in respiratory status, in ability to perform ADLs and exertion. No change in level of activity. Sometimes has SOB climbing stairs. Some dizziness in the morning (confirmed no DDI). Having difficulty losing weight (in fact, weight gain last few weeks) and has not been very active.   Denies fever, new cough or exposure related to COVID (vaccinated-Pfizer/booster 12/21); encouraged to get 2nd booster; +flu shot.  Has edema, denies pre-syncope and syncope  Medications verified.  Functional Class II    Review of Systems:  Constitutional: No fever, chills, night sweats.   Eyes: No vision changes.  HENT: No nasal congestion, runny nose.   Respiratory: As per HPI.   Cardiovascular: No chest pain, palpitations.   Gastrointestinal: No heartburn, reflux.   Neurological: No dizziness.   PH medication side effects: N/A  Rest ROS neg    Past Medical History:   Diagnosis  Date   ? Chronic musculoskeletal pain    ? GERD (gastroesophageal reflux disease)    ? Hypertension    ? Peripheral edema    ? Right leg pain    ? SOB (shortness of breath)        No past surgical history on file.    Family History   Problem Relation Name Age of Onset   ? Rheum arthritis Sister     ? Other (Cancer Malignancy.) Sister         Social History     Tobacco Use   ? Smoking status: Not on file   ? Smokeless tobacco: Not on file   Substance Use Topics   ? Alcohol use: Defer          Current Outpatient Medications:   ?  anastrozole (Arimidex) 1 mg tablet, Take 1 mg by mouth in the morning, Disp: , Rfl:   ?  furosemide (Lasix) 20 mg tablet, Take 20 mg by mouth in the morning., Disp: , Rfl:   ?  metoprolol succinate XL (Toprol-XL) 50 mg 24 hr tablet, Take 50 mg by mouth in the morning., Disp: , Rfl:   ?  multivitamin capsule, Take 1 capsule by mouth in the morning., Disp: , Rfl:   ?  omeprazole (PriLOSEC) 20 mg DR capsule, Take 20 mg by mouth in the morning and at bedtime., Disp: , Rfl:   ?  alpha tocopherol (Vitamin E) 100 unit capsule, Take 100 Units by mouth 1 (one) time each day., Disp: , Rfl:      No Known Allergies     Physical Examination:  Vitals:    06/04/21 0943   BP: (!) 162/77     General Appearance: alert and oriented x 3, no apparent distress, obese.   Oropharynx: Normal.   Nasal Mucosa: Normal.   Nasal Septum: Normal.   Neck: flat JVP .   Chest Inspection: Normal.   Breath sounds: Normal, without wheezes, rhonchi or rales.   Respiratory Effort: No use of accessory muscles.   Heart Exam: Regular rate and rhythm, normal S1/S2 progression,.   Gastrointestinal: normal abdomen, nontender.   Extremities: No cyanosis, no clubbing.   Lymphatic: No, cervical adenopathy, supraclavicular adenopathy.   Peripheral Vascular System: Trace edema both legs.   Musculoskeletal: muscle strength and tone normal.   Skin: Normal without rash or lesions.   Catheter site: n/a.     Assessment and Plan:  Patient Active Problem List   Diagnosis   ? Chronic musculoskeletal pain   ? GERD (gastroesophageal reflux disease)   ? Hypertension   ? Peripheral edema   ? Right leg pain   ? 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 symptoms improved with weight loss but regained weight  during vacation. Had planned to lose 30lbs, but has not lost weight so far - also not doing much (exertion/exercise)  With control of risks for diastolic dysfunction (especially weight) should improve.  Also needs better control of BP  Discussed at length with patient   Visit .

## 2021-12-10 ENCOUNTER — Other Ambulatory Visit

## 2021-12-10 ENCOUNTER — Ambulatory Visit: Admit: 2021-12-10 | Discharge: 2021-12-10 | Payer: PRIVATE HEALTH INSURANCE | Attending: Internal Medicine

## 2021-12-10 VITALS — BP 137/66 | HR 79 | Temp 98.1°F | Resp 20 | Ht 68.0 in | Wt 195.0 lb

## 2021-12-10 DIAGNOSIS — I272 Pulmonary hypertension, unspecified: Secondary | ICD-10-CM

## 2021-12-10 NOTE — Progress Notes (Signed)
 [facility]  [facility]  [address]  [place] [address]  Dept: [phone]    History of Present Illness:  New referral for ?PHTN (edema/abnormal ECHO)  (11/08/19) Alexis [name] is 21Y Female referred to [place] for DOE/SOB (suspected PHTN).   She becomes SOB walking a street block, climbing a flight of stairs (needs to recover at the top), carrying things, putting shoes or socks on, and talking on the phone. No SOB getting dressed or showering. For the last 6 months she has noticed she has become more fatigued and her daughter has noticed she has become less active.   Denies DVT/ PE, liver disease. +weight loss medications (no amphetamines. OTC meds only). Sister has RA (Not twins).   +OSA (CPAP 5-7 h nightly for a few months). Sleeping better but level of activity has not improved.   +HTN medications,   Denies DM, CAD, A-fib, HIV, IVDU, cocaine and meth use.  Former smoker quit 35y ago (1 ppd for 16 -20 years).   Experiences swelling in feet and legs also has some moments of dizziness  [name] number: [phone]  Denies fever, cough or exposure to COVID-19  Denies syncope.   No new or changes in medications  We are going to evaluate her for PHTN with RHC  Data  Apparently has ECHO s/o PHTN (we do not have report - just notation in records)    Since last visit, underwent RHC  RHC (12/20): RA 14/11 (9); RV 39/5, 12; PAP 42/18 (27); PCWP 18/18 (15); CO/CI (Fick) 5.94/2.96; CO/CI (TD) 4.90/2.44; PVR 161/195. PCWP increased with fluid challenge (in sum, mild PHTN a/w diastolic dysfunction (risks: age, sex, HTN, weight)    (07/10/20), Alexis [name] reports no change in respiratory status, in ability to perform ADLs and exertion. No change in level of activity. Gained weight during vacation to [place] (205lbs). Before trip to [place], she lost a lot of weight and noticed improved respiratory status and energy. She plans on losing 30lbs.  Denies fever, new cough or  exposure related to COVID (vaccinated-Pfizer, no SE).  Has trace edema, denies pre-syncope and syncope  Medications verified.  Functional Class II    (01/07/21), Alexis [name] reports no change in respiratory status, in ability to perform ADLs and exertion. No change in level of activity. Having difficulty losing weight and has not been very active.   Denies fever, new cough or exposure related to COVID (vaccinated-Pfizer/booster 12/21); + flu shot).  Has edema, denies pre-syncope and syncope  Medications verified.  Functional Class II    (06/04/2021) Alexis [name] reports no change in respiratory status, in ability to perform ADLs and exertion. No change in level of activity. Sometimes has SOB climbing stairs. Some dizziness in the morning (confirmed no DDI). Having difficulty losing weight (in fact, weight gain last few weeks) and has not been very active.   Denies fever, new cough or exposure related to COVID (vaccinated-Pfizer/booster 12/21); encouraged to get 2nd booster; +flu shot.  Has edema, denies pre-syncope and syncope  Medications verified.  Functional Class II    (12/10/21) Alexis [name] reports that her breathing is a little worse than last visit. Has had bronchitis that needed two courses of antibiotics. Had difficulty carrying luggage up stairs on recent flight. Has had multiple CXR ([place] and [place]; CXR 11/26 reports clear lungs). Even prior to bronchitis she noted breathing is still "less than last year" and she "gets tired"  Denies fever, new cough or  exposure related to COVID (vaccinated-Pfizer/booster 12/21); +bivalent booster  +flu shot.  Denies edema, pre-syncope and syncope  Medications verified.  FC  II/III    Review of Systems:  Constitutional: No fever, chills, night sweats.   Eyes: No vision changes.  HENT: No nasal congestion, runny nose.   Respiratory: As per HPI.   Cardiovascular: No chest pain, palpitations.   Gastrointestinal: No heartburn, reflux.   Neurological: No dizziness.   PH  medication side effects: N/A  Rest ROS neg    Past Medical History:   Diagnosis Date   . Chronic musculoskeletal pain    . GERD (gastroesophageal reflux disease)    . Hypertension    . Peripheral edema    . Right leg pain    . SOB (shortness of breath)        No past surgical history on file.    Family History   Problem Relation Name Age of Onset   . Rheum arthritis Sister     . Other (Cancer Malignancy.) Sister         Social History     Tobacco Use   . Smoking status: Not on file   . Smokeless tobacco: Not on file   Substance Use Topics   . Alcohol use: Defer         Current Outpatient Medications:   .  alpha tocopherol (Vitamin E) 100 unit capsule, Take 100 Units by mouth 1 (one) time each day., Disp: , Rfl:   .  anastrozole (Arimidex) 1 mg tablet, Take 1 mg by mouth in the morning, Disp: , Rfl:   .  furosemide (Lasix) 20 mg tablet, Take 20 mg by mouth in the morning., Disp: , Rfl:   .  metoprolol succinate XL (Toprol-XL) 50 mg 24 hr tablet, Take 50 mg by mouth in the morning., Disp: , Rfl:   .  multivitamin capsule, Take 1 capsule by mouth in the morning., Disp: , Rfl:   .  omeprazole (PriLOSEC) 20 mg DR capsule, Take 20 mg by mouth in the morning and at bedtime., Disp: , Rfl:      No Known Allergies     Physical Examination:  Vitals:    12/10/21 0948   BP: 137/66   Pulse: 79   Resp: 20   Temp: 36.7 C (98.1 F)   SpO2: 98%     Wt Readings from Last 6 Encounters:   12/10/21 88.5 kg   01/07/21 92.5 kg   07/10/20 93 kg   11/08/19 98.9 kg     Exam  General Appearance: alert and oriented x 3, no apparent distress, obese.   Oropharynx: Normal.   Nasal Mucosa: Normal.   Nasal Septum: Normal.   Neck: flat JVP .   Chest Inspection: Normal.   Breath sounds: Normal, without wheezes, rhonchi or rales.   Respiratory Effort: No use of accessory muscles.   Heart Exam: Regular rate and rhythm, normal S1/S2 progression, no mrg.  Gastrointestinal: normal abdomen, nontender.   Extremities: No cyanosis, no clubbing.   Lymphatic:  No, cervical adenopathy, supraclavicular adenopathy.   Peripheral Vascular System: Trace edema both legs.   Musculoskeletal: muscle strength and tone normal.   Skin: Normal without rash or lesions.   Catheter site: n/a.     Assessment and Plan:  Patient Active Problem List   Diagnosis   . Chronic musculoskeletal pain   . GERD (gastroesophageal reflux disease)   . Hypertension   . Peripheral edema   . Right  leg pain   . 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 symptoms improved with weight loss but regained weight during vacation. Had planned to lose 30lbs, but has not lost weight so far - also not doing much (exertion/exercise)  With control of risks for diastolic dysfunction (especially weight) should improve. We recommended this again today.  Continue current regimen - diuretics as needed  Discussed at length with patient   Visit 

## 2022-05-30 NOTE — Telephone Encounter (Signed)
Hello,  Patient called to request a refill of:     Furosemide 20 mg     Pharmacy, pharmacy address, and phone number:       [facility] [place]     Informed patient it can take 2 business days to send prescription refill to pharmacy.     Verified insurance with patient OR in Epic (select one)     Call back number: [phone]    Thank you!

## 2022-06-01 MED ORDER — furosemide (Lasix) 20 mg tablet
20 | ORAL_TABLET | Freq: Every day | ORAL | 11 refills | Status: AC
Start: 2022-06-01 — End: 2023-06-01

## 2022-06-10 ENCOUNTER — Ambulatory Visit: Payer: PRIVATE HEALTH INSURANCE | Attending: Internal Medicine

## 2022-07-15 ENCOUNTER — Ambulatory Visit: Admit: 2022-07-15 | Discharge: 2022-07-16 | Payer: PRIVATE HEALTH INSURANCE | Attending: Internal Medicine

## 2022-07-15 DIAGNOSIS — I272 Pulmonary hypertension, unspecified: Secondary | ICD-10-CM

## 2022-07-15 NOTE — Progress Notes (Signed)
[name]  [place] [address]  Dept: [phone]    History of Present Illness:  New referral for ?PHTN (edema/abnormal ECHO)  (11/08/19) Alexis [name] is 77Y Female referred to [place] for DOE/SOB (suspected PHTN).   She becomes SOB walking a street block, climbing a flight of stairs (needs to recover at the top), carrying things, putting shoes or socks on, and talking on the phone. No SOB getting dressed or showering. For the last 6 months she has noticed she has become more fatigued and her daughter has noticed she has become less active.   Denies DVT/ PE, liver disease. +weight loss medications (no amphetamines. OTC meds only). Sister has RA (Not twins).   +OSA (CPAP 5-7 h nightly for a few months). Sleeping better but level of activity has not improved.   +HTN medications,   Denies DM, CAD, A-fib, HIV, IVDU, cocaine and meth use.  Former smoker quit 35y ago (1 ppd for 16 -20 years).   Experiences swelling in feet and legs also has some moments of dizziness  Daughter: Alexis [name] Phone number: [phone]  Denies fever, cough or exposure to COVID-19  Denies syncope.   No new or changes in medications  We are going to evaluate her for PHTN with RHC  Data  Apparently has ECHO s/o PHTN (we do not have report - just notation in records)    Since last visit, underwent RHC  RHC (12/20): RA 14/11 (9); RV 39/5, 12; PAP 42/18 (27); PCWP 18/18 (15); CO/CI (Fick) 5.94/2.96; CO/CI (TD) 4.90/2.44; PVR 161/195. PCWP increased with fluid challenge (in sum, mild PHTN a/w diastolic dysfunction (risks: age, sex, HTN, weight)    (07/10/20), Alexis [name] reports no change in respiratory status, in ability to perform ADLs and exertion. No change in level of activity. Gained weight during vacation to [place] (205lbs). Before trip to [place] she lost a lot of weight and noticed improved respiratory status and energy. She plans on losing 30lbs.  Denies fever, new cough or  exposure related to COVID (vaccinated-Pfizer, no SE).  Has trace edema, denies pre-syncope and syncope  Medications verified.  Functional Class II    (01/07/21), Alexis [name] reports no change in respiratory status, in ability to perform ADLs and exertion. No change in level of activity. Having difficulty losing weight and has not been very active.   Denies fever, new cough or exposure related to COVID (vaccinated-Pfizer/booster 12/21); + flu shot).  Has edema, denies pre-syncope and syncope  Medications verified.  Functional Class II    (06/04/2021) Alexis [name] reports no change in respiratory status, in ability to perform ADLs and exertion. No change in level of activity. Sometimes has SOB climbing stairs. Some dizziness in the morning (confirmed no DDI). Having difficulty losing weight (in fact, weight gain last few weeks) and has not been very active.   Denies fever, new cough or exposure related to COVID (vaccinated-Pfizer/booster 12/21); encouraged to get 2nd booster; +flu shot.  Has edema, denies pre-syncope and syncope  Medications verified.  Functional Class II    (12/10/21) Alexis [name] reports that her breathing is a little worse than last visit. Has had bronchitis that needed two courses of antibiotics. Had difficulty carrying luggage up stairs on recent flight. Has had multiple CXR ([place] and [place]; CXR 11/26 reports clear lungs). Even prior to bronchitis she noted breathing is still "less than last year" and she "gets tired"  Denies fever, new cough or  exposure related to COVID (vaccinated-Pfizer/booster 12/21); +bivalent booster  +flu shot.  Denies edema, pre-syncope and syncope  Medications verified.  FC  II/III    (07/15/22): Alexis [name] is now Afib (unsure length of time). Prior to the Afib respiratory status was good and she felt fine. BP was slightly elevated today.   Inquired about weight loss drugs.   Reports significant edema in her legs, improves slightly by the morning  Medications verified    Review  of Systems:  Constitutional: No fever, chills, night sweats.   Eyes: No vision changes.  HENT: No nasal congestion, runny nose.   Respiratory: As per HPI.   Cardiovascular: No chest pain, palpitations.   Gastrointestinal: No heartburn, reflux.   Neurological: No dizziness.   PH medication side effects: N/A  Rest ROS neg    Past Medical History:   Diagnosis Date   . Chronic musculoskeletal pain    . GERD (gastroesophageal reflux disease)    . Hypertension    . Peripheral edema    . Right leg pain    . SOB (shortness of breath)        No past surgical history on file.    Family History   Problem Relation Name Age of Onset   . Rheum arthritis Sister     . Other (Cancer Malignancy.) Sister         Social History     Tobacco Use   . Smoking status: Not on file   . Smokeless tobacco: Not on file   Substance Use Topics   . Alcohol use: Defer         Current Outpatient Medications:   .  alpha tocopherol (Vitamin E) 100 unit capsule, Take 100 Units by mouth 1 (one) time each day., Disp: , Rfl:   .  amLODIPine (Norvasc) 5 mg tablet, Take by mouth once daily., Disp: , Rfl:   .  anastrozole (Arimidex) 1 mg tablet, Take 1 mg by mouth in the morning, Disp: , Rfl:   .  apixaban (Eliquis) 5 mg tablet, Take 5 mg by mouth twice daily., Disp: , Rfl:   .  furosemide (Lasix) 20 mg tablet, Take 1 tablet (20 mg) by mouth once daily., Disp: 30 tablet, Rfl: 11  .  metoprolol succinate XL (Toprol-XL) 50 mg 24 hr tablet, Take 50 mg by mouth in the morning., Disp: , Rfl:   .  multivitamin capsule, Take 1 capsule by mouth in the morning., Disp: , Rfl:   .  omeprazole (PriLOSEC) 20 mg DR capsule, Take 20 mg by mouth in the morning and at bedtime., Disp: , Rfl:      Not on File     Physical Examination:  Vitals:    07/15/22 1618   BP: 152/84   Pulse:    Resp:    Temp:    SpO2:      Wt Readings from Last 6 Encounters:   07/15/22 90.4 kg   12/10/21 88.5 kg   01/07/21 92.5 kg   07/10/20 93 kg   11/08/19 98.9 kg     Exam  General Appearance: alert  and oriented x 3, no apparent distress, obese.   HEENT neg  Neck: Flat JVP  Respiratory: Lungs are clear  Cardiac: irregular (A-fib); no P2 or murmur   Abd: soft, nontender  Lower extremities: Trace edema bilaterally   No C/C  No rashes      Assessment and Plan:  Patient Active Problem List   Diagnosis   .  Chronic musculoskeletal pain   . GERD (gastroesophageal reflux disease)   . Hypertension   . Peripheral edema   . Right leg pain   . 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 symptoms improved with weight loss but regained weight during vacation. Had planned to lose 30lbs, but has not lost weight so far - also not doing much (exertion/exercise) and now has developed Afib  With control of risks for diastolic dysfunction (especially weight) should improve. We recommended this again today.  Continue current regimen - diuretics as needed  Discussed at length with patient   Visit 

## 2023-01-20 ENCOUNTER — Ambulatory Visit: Admit: 2023-01-20 | Discharge: 2023-01-20 | Payer: PRIVATE HEALTH INSURANCE | Attending: Internal Medicine

## 2023-01-20 ENCOUNTER — Inpatient Hospital Stay: Admit: 2023-01-20 | Payer: PRIVATE HEALTH INSURANCE

## 2023-01-20 DIAGNOSIS — I272 Pulmonary hypertension, unspecified: Secondary | ICD-10-CM

## 2023-01-20 NOTE — Progress Notes (Signed)
[facility]  [facility]  [address]  [place] [address]  Dept: [phone]    History of Present Illness:  New referral for ?PHTN (edema/abnormal ECHO)  (11/08/19) Ms. Alexis [name] is 3Y Female referred to [place] for DOE/SOB (suspected PHTN).   She becomes SOB walking a street block, climbing a flight of stairs (needs to recover at the top), carrying things, putting shoes or socks on, and talking on the [name]. No SOB getting dressed or showering. For the last 6 months she has noticed she has become more fatigued and her daughter has noticed she has become less active.   Denies DVT/ PE, liver disease. +weight loss medications (no amphetamines. OTC meds only). Sister has RA (Not twins).   +OSA (CPAP 5-7 h nightly for a few months). Sleeping better but level of activity has not improved.   +HTN medications,   Denies DM, CAD, A-fib, HIV, IVDU, cocaine and meth use.  Former smoker quit 35y ago (1 ppd for 16 -20 years).   Experiences swelling in feet and legs also has some moments of dizziness  Daughter: Alexis [name] number: [phone]  Denies fever, cough or exposure to COVID-19  Denies syncope.   No new or changes in medications  We are going to evaluate her for PHTN with RHC  Data  Apparently has ECHO s/o PHTN (we do not have report - just notation in records)    Since last visit, underwent RHC  RHC (12/20): RA 14/11 (9); RV 39/5, 12; PAP 42/18 (27); PCWP 18/18 (15); CO/CI (Fick) 5.94/2.96; CO/CI (TD) 4.90/2.44; PVR 161/195. PCWP increased with fluid challenge (in sum, mild PHTN a/w diastolic dysfunction (risks: age, sex, HTN, weight)    (07/10/20), Ms. [name] reports no change in respiratory status, in ability to perform ADLs and exertion. No change in level of activity. Gained weight during vacation to [place] (205lbs). Before trip to [place], she lost a lot of weight and noticed improved respiratory status and energy. She plans on losing 30lbs.  Denies fever, new cough or  exposure related to COVID (vaccinated-Pfizer, no SE).  Has trace edema, denies pre-syncope and syncope  Medications verified.  Functional Class II    (01/07/21), Ms. [name] reports no change in respiratory status, in ability to perform ADLs and exertion. No change in level of activity. Having difficulty losing weight and has not been very active.   Denies fever, new cough or exposure related to COVID (vaccinated-Pfizer/booster 12/21); + flu shot).  Has edema, denies pre-syncope and syncope  Medications verified.  Functional Class II    (06/04/2021) Ms. [name] reports no change in respiratory status, in ability to perform ADLs and exertion. No change in level of activity. Sometimes has SOB climbing stairs. Some dizziness in the morning (confirmed no DDI). Having difficulty losing weight (in fact, weight gain last few weeks) and has not been very active.   Denies fever, new cough or exposure related to COVID (vaccinated-Pfizer/booster 12/21); encouraged to get 2nd booster; +flu shot.  Has edema, denies pre-syncope and syncope  Medications verified.  Functional Class II    (12/10/21) Ms. [name] reports that her breathing is a little worse than last visit. Has had bronchitis that needed two courses of antibiotics. Had difficulty carrying luggage up stairs on recent flight. Has had multiple CXR ([place] and [place]; CXR 11/26 reports clear lungs). Even prior to bronchitis she noted breathing is still "less than last year" and she "gets tired"  Denies fever, new cough or  exposure related to COVID (vaccinated-Pfizer/booster 12/21); +bivalent booster  +flu shot.  Denies edema, pre-syncope and syncope  Medications verified.  FC  II/III    (07/15/22): Ms. [name] is now Afib (unsure length of time). Prior to the Afib respiratory status was good and she felt fine. BP was slightly elevated today.   Inquired about weight loss drugs.   Reports significant edema in her legs, improves slightly by the morning  Medications  verified    (01/20/23): Ms. [name] reports minimal SOB, but compared to last visit states it is improved.   Diagnosed with Afib (amiodarone; ablation scheduled for May)   Reports edema, has been wearing compression socks with improvement.   Notes that it improves overnight  Also reports tingling in her toes that is associated with the swelling.   Medications verified  ECHO today: Normal LV size, wall thickness, systolic function; LVEF 60%. Diastolic parameters are abnormal. No WMA. RV mild to moderately dilated w/ normal wall thickness and normal systolic function; TAPSE 2.5; TDI S' 16.8. LA normal/RA normal. IVC normal w / >50% respirophasic variation. Lipomatous hypertrophy IAS w/ no ASD. Trace MR. Trace TR w/ normal Rvsp. Mild AR. Trace PR. Borderline dilated ascending aorta. PA normal. No pericardial effusion.    Review of Systems:  Constitutional: No fever, chills, night sweats.   Eyes: No vision changes.  HENT: No nasal congestion, runny nose.   Respiratory: As per HPI.   Cardiovascular: No chest pain, palpitations.   Gastrointestinal: No heartburn, reflux.   Neurological: No dizziness.   PH medication side effects: N/A  Rest ROS neg    Past Medical History:   Diagnosis Date   . Chronic musculoskeletal pain    . GERD (gastroesophageal reflux disease)    . Hypertension    . Peripheral edema    . Right leg pain    . SOB (shortness of breath)        No past surgical history on file.    Family History   Problem Relation Name Age of Onset   . Rheum arthritis Sister     . Other (Cancer Malignancy.) Sister         Social History     Tobacco Use   . Smoking status: Not on file   . Smokeless tobacco: Not on file   Substance Use Topics   . Alcohol use: Defer         Current Outpatient Medications:   .  alpha tocopherol (Vitamin E) 100 unit capsule, Take 100 Units by mouth 1 (one) time each day., Disp: , Rfl:   .  amLODIPine (Norvasc) 5 mg tablet, Take by mouth once daily., Disp: , Rfl:   .  anastrozole (Arimidex) 1 mg  tablet, Take 1 mg by mouth in the morning, Disp: , Rfl:   .  apixaban (Eliquis) 5 mg tablet, Take 5 mg by mouth twice daily., Disp: , Rfl:   .  furosemide (Lasix) 20 mg tablet, TAKE 1 TABLET(20 MG) BY MOUTH EVERY DAY, Disp: 30 tablet, Rfl: 11  .  metoprolol succinate XL (Toprol-XL) 50 mg 24 hr tablet, Take 50 mg by mouth in the morning., Disp: , Rfl:   .  multivitamin capsule, Take 1 capsule by mouth in the morning., Disp: , Rfl:   .  omeprazole (PriLOSEC) 20 mg DR capsule, Take 20 mg by mouth in the morning and at bedtime., Disp: , Rfl:      No Known Allergies     Physical Examination:  Vitals:    01/20/23 1152   BP: 134/79   Pulse: 66   Resp: 16   Temp: 37.1 C (98.7 F)   SpO2: 98%     Wt Readings from Last 6 Encounters:   01/20/23 88.5 kg   07/15/22 90.4 kg   12/10/21 88.5 kg   01/07/21 92.5 kg   07/10/20 93 kg   11/08/19 98.9 kg     Exam  General Appearance: alert and oriented x 3, no apparent distress  HEENT neg  Neck: Flat JVP  Respiratory: Lungs are clear  Cardiac: Irregular (afib) no P2 or murmur   Abd: soft, nontender  Lower extremities: no edema bilaterally   No C/C  No rashes      Assessment and Plan:  Patient Active Problem List   Diagnosis   . Chronic musculoskeletal pain   . GERD (gastroesophageal reflux disease)   . Hypertension   . Peripheral edema   . Right leg pain   . 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 symptoms improved with weight loss but regained weight during vacation. Had planned to lose 30lbs, but has not lost weight so far - also not doing much (exertion/exercise)   Now has developed Afib (controlled w/ amio; ablation scheduled for May)  Emphasized weight loss  ECHO good  Discussed at length with patient   Send notes to Dr. Brunilda Payor and Dr. Jolene Schimke    Visit 

## 2023-04-24 IMAGING — MR MRI LUMBAR SPINE WITHOUT CONTRAST
7 of 9 series · 15 of 48 positions shown · IV contrast (gadolinium)
Comparison: Lumbar spine x-ray April 16, 2023.

________________________________________________________________________________________________ 
MRI LUMBAR SPINE WITHOUT CONTRAST, 04/24/2023 [DATE]: 
CLINICAL INDICATION: Fall one month ago. Follow-up L4 fracture.
TECHNIQUE: Multiplanar, multiecho position MR images of the lumbar spine were 
performed without intravenous gadolinium enhancement. Patient was scanned on a 
1.5T magnet

[Series 101: survey · axial · 10.0mm · 1.25mm/px · 1 of 10 slices shown]
[im 1/10]
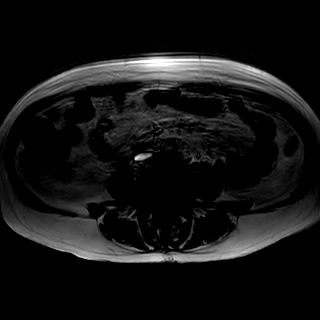

[Series 201: t2w_cor-surv · coronal · 6.0mm · 0.56mm/px · 1 of 10 slices shown]
[im 1/10]
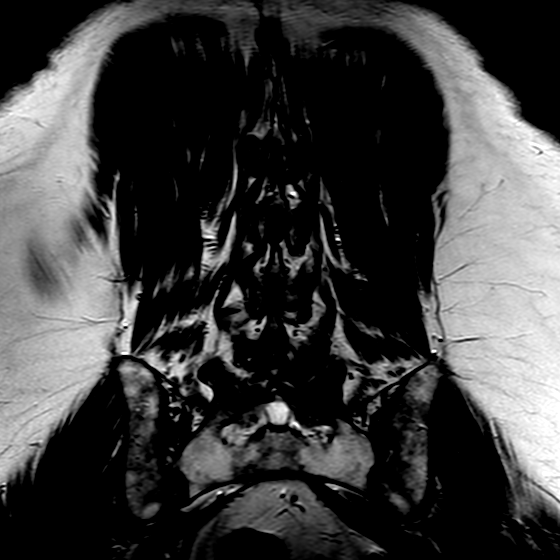

[Series 301: t1_tse_sag · sagittal · 4.0mm · 0.47mm/px · 2 of 19 slices shown]
[im 1/19]
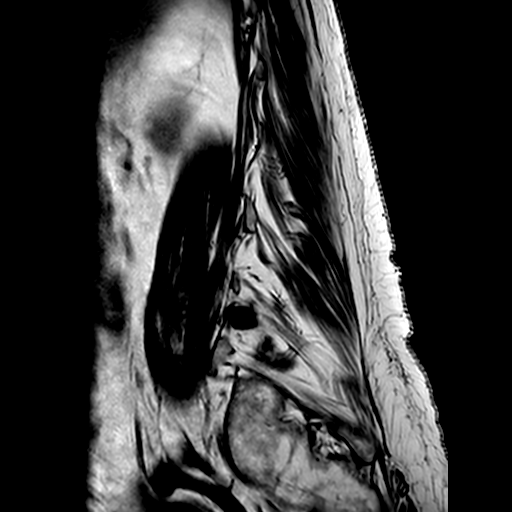
[im 19/19]
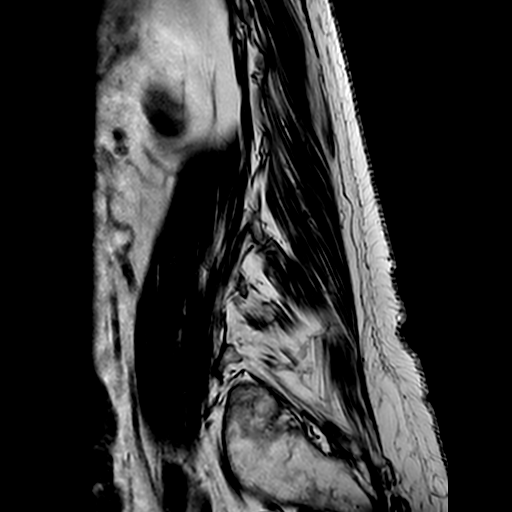

[Series 402: (id)_mdixon_tse · sagittal · 4.0mm · 0.56mm/px · 2 of 19 slices shown]
[im 1/19]
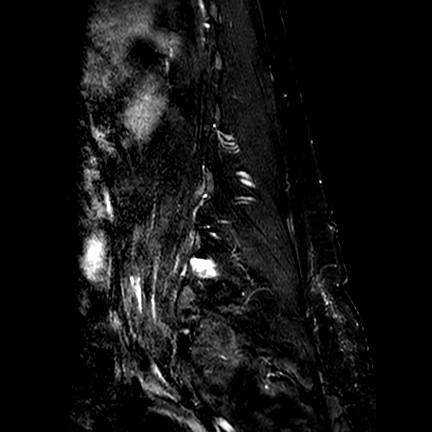
[im 19/19]
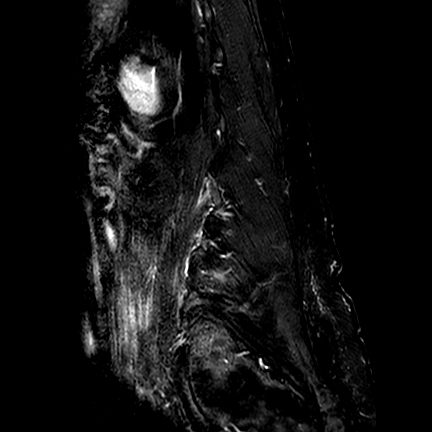

[Series 403: st2w_mdixon_tse · sagittal · 4.0mm · 0.56mm/px · 2 of 19 slices shown]
[im 1/19]
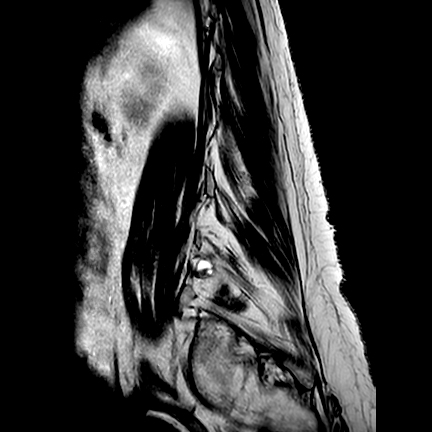
[im 19/19]
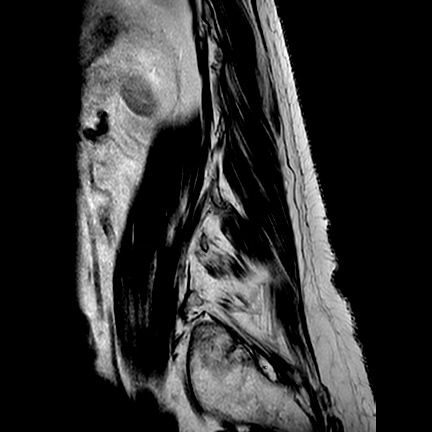

[Series 502: (id) view_ax mpr · axial · 1.0mm · 0.25mm/px · 1 of 150 slices shown]
[im 10/150]
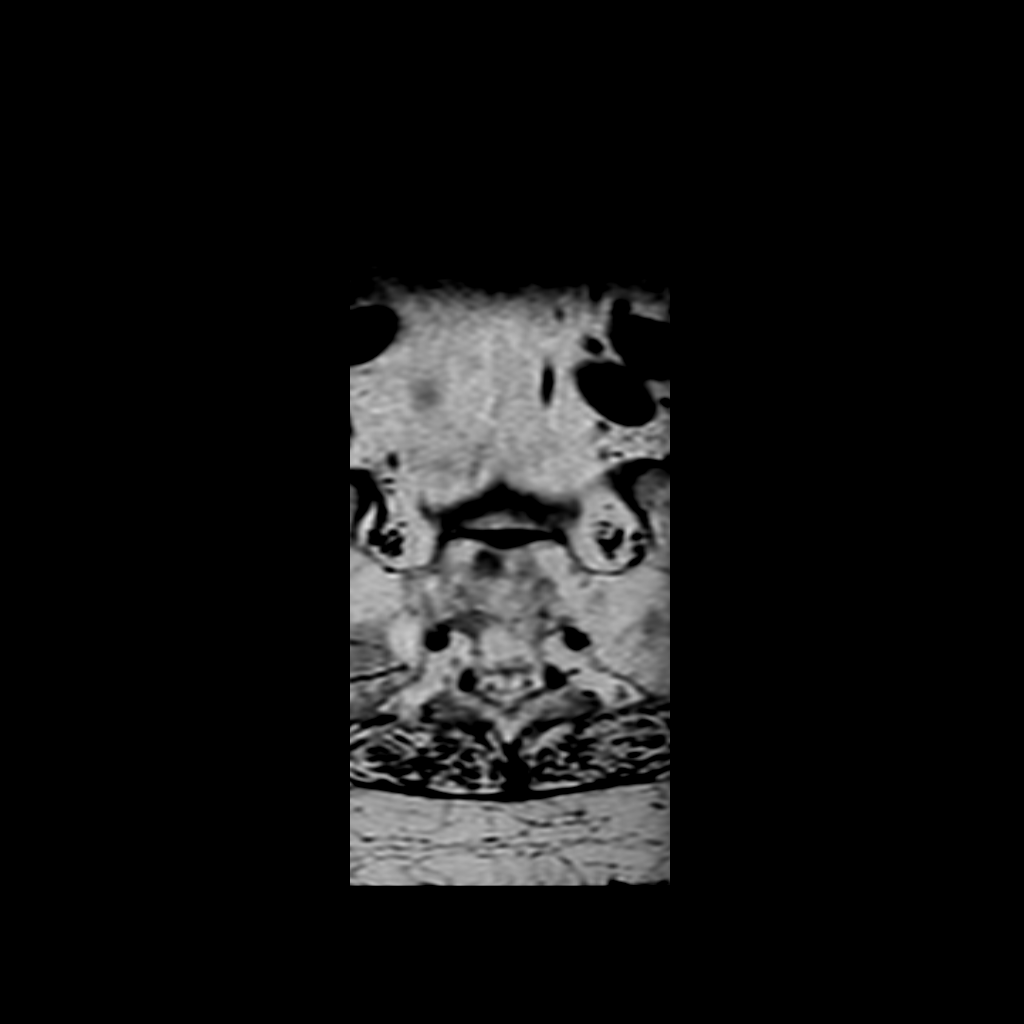

[Series 701: T1 · axial · 4.0mm · 0.39mm/px · z∈[-87,+124]mm · 6 of 49 slices shown]
[im 1/49]
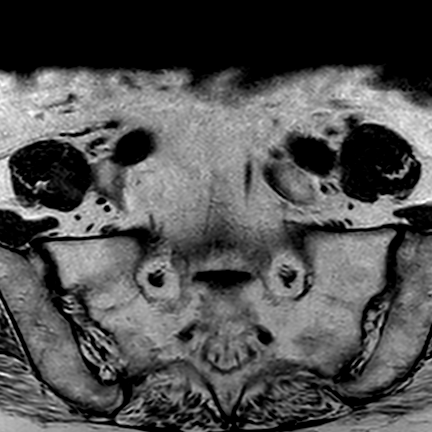
[im 10/49]
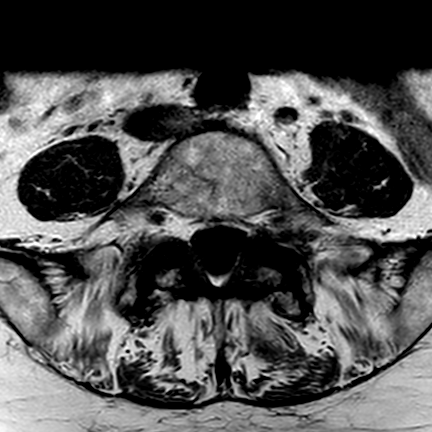
[im 20/49]
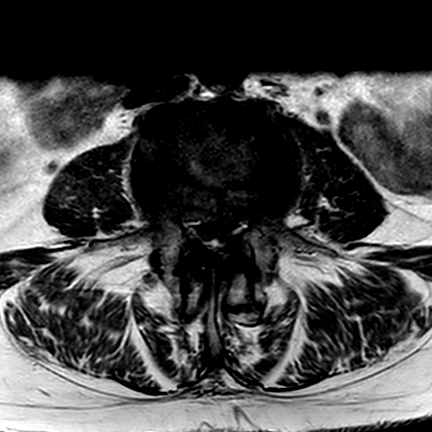
[im 29/49]
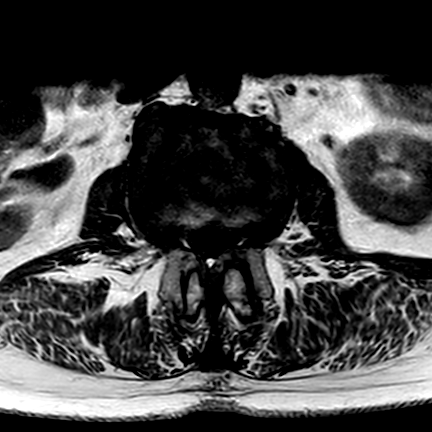
[im 39/49]
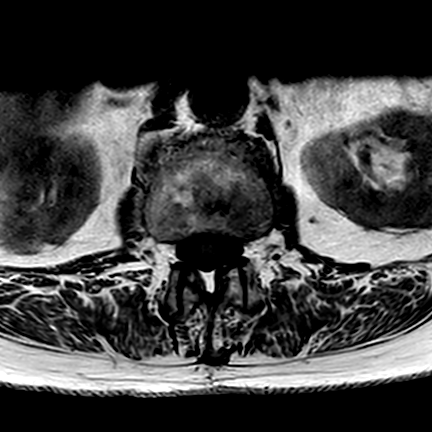
[im 49/49]
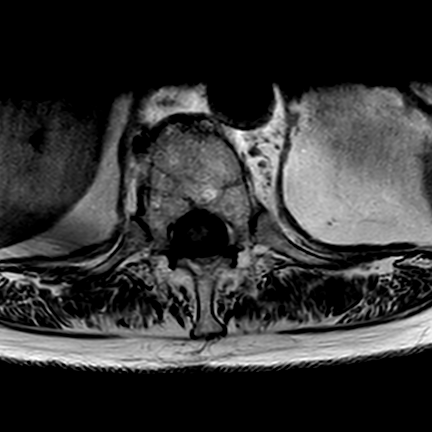

[15 of 48 positions shown; findings below may reference images not displayed]

FINDINGS: -------------------------------------------------------------------------------- 
------ 
GENERAL: 
Nomenclature is based on 5 lumbar type vertebral bodies.     
ALIGNMENT: Normal coronal alignment. Loss of the normal lumbar lordosis with 
grade 1 retrolisthesis L2 on L3 and L3 on L4. 
VERTEBRAL BODY HEIGHT: Acute subacute moderate L4 compression deformity with 
loss of vertebral body height of 57% in the central aspect. There is marrow 
edema throughout the L4 vertebral body and extends into both pedicles. No 
significant osseous retropulsion.  
MARROW SIGNAL: No focal suspect signal abnormality. 
CORD SIGNAL: Normal distal spinal cord and cauda equina. Conus medullaris 
terminates at L2. 
ADDITIONAL FINDINGS: Fluid collection is present within the left posterior 
paraspinous soft tissues at the L5 level. This collection measures 2.0 x 0.8 cm 
in transverse dimension and 2.2 cm in craniocaudal dimension. No communication 
to the thecal sac. Incompletely visualized is a cystic lesion involving the 
anterior and inferior aspect of the right kidney, measuring at least 2.0 cm. 
There is an additional fluid collection in the right posterior paraspinous soft 
tissues image 10 series 601, measuring 10 x 9 x 10 mm in dimension, 
indeterminate. This is adjacent to the right transverse process of L5. 
Modic I-II: None. 
Ligamentum Flavum > 2.5 mm: All levels. 
-------------------------------------------------------------------------------- 
------ 
SEGMENTAL: 
T12-L1: Loss of disc signal. Minimal annular bulge. Canal and foramina are 
patent. Normal facets. 
L1-L2: Mild loss of disc height and signal. Anterior osteophytes. Minimal 
annular bulge. Borderline canal stenosis. Foramina patent. Mild facet 
arthropathy. 
L2-L3: Schmorls node. Mild loss of disc height and signal. Moderate canal 
stenosis with lateral recess narrowing bilaterally. Facet arthropathy with 
ligamentum plate hypertrophy. There is moderate left foraminal narrowing. Right 
neural foramen is patent. 
L3-L4: Ballooning of the disc. Loss of disc signal. Severe canal stenosis with 
cauda equina compression. Canal measures 3 mm in AP dimension. Lateral recess 
narrowing bilaterally. 5 mm left-sided facet synovial cyst is embedded within 
the left ligamentum flavum. Moderate left foraminal narrowing. The right neural 
foramen is mildly narrowed. 
L4-L5: Ballooning of the disc. Loss of disc signal. Mild diffuse annular bulge 
with mild to moderate canal stenosis. Slight narrowing right lateral recess. 
Facet arthropathy. Ligamentum flavum hypertrophy. Left foramen is borderline 
narrowed. Right foramen is moderately narrowed. 
L5-S1: Loss of disc signal. Mild diffuse annular bulge. Canal patent. Small 
bilateral facet joint effusions with facet arthropathy. Foramina patent. 
-------------------------------------------------------------------------------- 
------
IMPRESSION: Acute subacute L4 compression fracture deformity. Appearance similar to previous 
radiographs. No significant osseous retropulsion. 
There are 2 separate posterior paraspinous fluid collections, detailed above, 
indeterminate. These do not communicate to the thecal sac. 
Severe canal stenosis with cauda equina compression L3-L4. 
Moderate canal stenosis with lateral recess narrowing bilaterally L2-L3. Lateral 
recess narrowing bilaterally. 
Mild to moderate canal stenosis L4-L5 with slight narrowing right lateral 
recess. 
Other, less significant lumbar degenerative changes as detailed above.

## 2023-07-21 ENCOUNTER — Ambulatory Visit: Payer: PRIVATE HEALTH INSURANCE | Attending: Internal Medicine

## 2023-08-25 ENCOUNTER — Ambulatory Visit: Admit: 2023-08-25 | Discharge: 2023-08-25 | Payer: PRIVATE HEALTH INSURANCE | Attending: Internal Medicine

## 2023-08-25 DIAGNOSIS — I272 Pulmonary hypertension, unspecified: Secondary | ICD-10-CM

## 2023-08-25 NOTE — Progress Notes (Signed)
[facility]  [facility]  [address]  [place] [address]  Dept: [phone]    History of Present Illness:  New referral for ?PHTN (edema/abnormal ECHO)  (11/08/19) Alexis [name] is 25Y Female referred to [place] for DOE/SOB (suspected PHTN).   She becomes SOB walking a street block, climbing a flight of stairs (needs to recover at the top), carrying things, putting shoes or socks on, and talking on the phone. No SOB getting dressed or showering. For the last 6 months she has noticed she has become more fatigued and her daughter has noticed she has become less active.   Denies DVT/ PE, liver disease. +weight loss medications (no amphetamines. OTC meds only). Sister has RA (Not twins).   +OSA (CPAP 5-7 h nightly for a few months). Sleeping better but level of activity has not improved.   +HTN medications,   Denies DM, CAD, A-fib, HIV, IVDU, cocaine and meth use.  Former smoker quit 35y ago (1 ppd for 16 -20 years).   Experiences swelling in feet and legs also has some moments of dizziness  Daughter: Alexis [name] Phone number: [phone]  Denies fever, cough or exposure to COVID-19  Denies syncope.   No new or changes in medications  We are going to evaluate her for PHTN with RHC  Data  Apparently has ECHO s/o PHTN (we do not have report - just notation in records)    Since last visit, underwent RHC  RHC (12/20): RA 14/11 (9); RV 39/5, 12; PAP 42/18 (27); PCWP 18/18 (15); CO/CI (Fick) 5.94/2.96; CO/CI (TD) 4.90/2.44; PVR 161/195. PCWP increased with fluid challenge (in sum, mild PHTN a/w diastolic dysfunction (risks: age, sex, HTN, weight)    (07/10/20), Alexis [name] reports no change in respiratory status, in ability to perform ADLs and exertion. No change in level of activity. Gained weight during vacation to [place] (205lbs). Before trip to [place], she lost a lot of weight and noticed improved respiratory status and energy. She plans on losing 30lbs.  Denies fever, new cough or  exposure related to COVID (vaccinated-Pfizer, no SE).  Has trace edema, denies pre-syncope and syncope  Medications verified.  Functional Class II    (01/07/21), Alexis [name] reports no change in respiratory status, in ability to perform ADLs and exertion. No change in level of activity. Having difficulty losing weight and has not been very active.   Denies fever, new cough or exposure related to COVID (vaccinated-Pfizer/booster 12/21); + flu shot).  Has edema, denies pre-syncope and syncope  Medications verified.  Functional Class II    (06/04/2021) Alexis [name] reports no change in respiratory status, in ability to perform ADLs and exertion. No change in level of activity. Sometimes has SOB climbing stairs. Some dizziness in the morning (confirmed no DDI). Having difficulty losing weight (in fact, weight gain last few weeks) and has not been very active.   Denies fever, new cough or exposure related to COVID (vaccinated-Pfizer/booster 12/21); encouraged to get 2nd booster; +flu shot.  Has edema, denies pre-syncope and syncope  Medications verified.  Functional Class II    (12/10/21) Alexis [name] reports that her breathing is a little worse than last visit. Has had bronchitis that needed two courses of antibiotics. Had difficulty carrying luggage up stairs on recent flight. Has had multiple CXR ([place] and [place]; CXR 11/26 reports clear lungs). Even prior to bronchitis she noted breathing is still "less than last year" and she "gets tired"  Denies fever, new cough or  exposure related to COVID (vaccinated-Pfizer/booster 12/21); +bivalent booster  +flu shot.  Denies edema, pre-syncope and syncope  Medications verified.  FC  II/III    (07/15/22): Alexis [name] is now Afib (unsure length of time). Prior to the Afib respiratory status was good and she felt fine. BP was slightly elevated today.   Inquired about weight loss drugs.   Reports significant edema in her legs, improves slightly by the morning  Medications  verified    (01/20/23): Alexis [name] reports minimal SOB, but compared to last visit states it is improved.   Diagnosed with Afib (amiodarone; ablation scheduled for May)   Reports edema, has been wearing compression socks with improvement.   Notes that it improves overnight  Also reports tingling in her toes that is associated with the swelling.   Medications verified  ECHO today: Normal LV size, wall thickness, systolic function; LVEF 60%. Diastolic parameters are abnormal. No WMA. RV mild to moderately dilated w/ normal wall thickness and normal systolic function; TAPSE 2.5; TDI S' 16.8. LA normal/RA normal. IVC normal w / >50% respirophasic variation. Lipomatous hypertrophy IAS w/ no ASD. Trace MR. Trace TR w/ normal Rvsp. Mild AR. Trace PR. Borderline dilated ascending aorta. PA normal. No pericardial effusion.    (08/25/23): Alexis [name] reports that her breathing is the same. Gets out of breath easily but recovers well  Underwent ablation  Fell in may (mechanical) and fractured her spine - unclear which vertebrae   Taking gabapentin for back pain   Reports edema, improves overnight if she elevates her feet  Notes that it improves overnight  Also reports tingling in her toes that is associated with the swelling.   Current weight 195lbs  Medication reviewed  A complete 10-point review of systems was performed. Pertinent positives and negatives are listed in the HPI.   ROS per HPI  Rest ROS neg  Other issues per previous notes.     Review of Systems:  Constitutional: No fever, chills, night sweats.   Eyes: No vision changes.  HENT: No nasal congestion, runny nose.   Respiratory: As per HPI.   Cardiovascular: No chest pain, palpitations.   Gastrointestinal: No heartburn, reflux.   Neurological: No dizziness.   PH medication side effects: N/A  Rest ROS neg    Past Medical History:   Diagnosis Date    Chronic musculoskeletal pain     GERD (gastroesophageal reflux disease)     Hypertension     Peripheral edema     Right leg  pain     SOB (shortness of breath)        No past surgical history on file.    Family History   Problem Relation Name Age of Onset    Rheum arthritis Sister      Other (Cancer Malignancy.) Sister         Social History     Tobacco Use    Smoking status: Not on file    Smokeless tobacco: Not on file   Substance Use Topics    Alcohol use: Defer         Current Outpatient Medications:     alpha tocopherol (Vitamin E) 100 unit capsule, Take 100 Units by mouth 1 (one) time each day., Disp: , Rfl:     amiodarone (Pacerone) 100 mg tablet, Take by mouth once daily., Disp: , Rfl:     amLODIPine (Norvasc) 5 mg tablet, Take by mouth once daily., Disp: , Rfl:     anastrozole (Arimidex)  1 mg tablet, Take 1 mg by mouth in the morning, Disp: , Rfl:     furosemide (Lasix) 20 mg tablet, TAKE 1 TABLET(20 MG) BY MOUTH EVERY DAY, Disp: 30 tablet, Rfl: 11    multivitamin capsule, Take 1 capsule by mouth in the morning., Disp: , Rfl:     omeprazole (PriLOSEC) 20 mg DR capsule, Take 20 mg by mouth in the morning and at bedtime., Disp: , Rfl:     rivaroxaban (Xarelto) 20 mg tablet, Take 20 mg by mouth once daily. Take with food., Disp: , Rfl:     rosuvastatin (Crestor) 20 mg tablet, Take 20 mg by mouth once daily., Disp: , Rfl:      No Known Allergies     Physical Examination:  There were no vitals filed for this visit.    Wt Readings from Last 6 Encounters:   01/20/23 88.5 kg   07/15/22 90.4 kg   12/10/21 88.5 kg   01/07/21 92.5 kg   07/10/20 93 kg   11/08/19 98.9 kg     Exam  General Appearance: alert and oriented x 3, no apparent distress  HEENT neg  Neck: Flat JVP  Respiratory: Lungs are clear  Cardiac:  RRR no P2, + murmur   Abd: soft, nontender  Lower extremities: trace edema bilaterally   No C/C  No rashes      Assessment and Plan:  Patient Active Problem List   Diagnosis    Chronic musculoskeletal pain    GERD (gastroesophageal reflux disease)    Hypertension (CMS-HCC)    Peripheral edema    Right leg pain    SOB (shortness of  breath)     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 symptoms improved with weight loss but regained weight during vacation. Had planned to lose 30lbs, but has not lost weight so far - also not doing much (exertion/exercise)   Now has developed Afib (controlled w/ amio; s/p ablation)  Emphasized weight loss/ talk to PCP about nutritionist  ECHO good  Discussed at length with patient   Send notes to Dr. Brunilda Payor and Dr. Jolene Schimke   Visit 

## 2024-01-07 MED ORDER — furosemide (Lasix) 20 mg tablet
20 | ORAL_TABLET | Freq: Every day | ORAL | 11 refills | Status: DC
Start: 2024-01-07 — End: 2024-02-08

## 2024-02-08 MED ORDER — furosemide (Lasix) 20 mg tablet
20 | ORAL_TABLET | Freq: Every day | ORAL | 11 refills | Status: AC
Start: 2024-02-08 — End: 2025-02-02

## 2024-02-08 NOTE — Telephone Encounter (Signed)
 [facility] - [place] [address]    Patient needs Lasix refill sent to pharmacy above.    Patient can be reached at [phone]    Thank you.

## 2024-02-23 ENCOUNTER — Ambulatory Visit: Payer: PRIVATE HEALTH INSURANCE | Attending: Internal Medicine

## 2024-06-20 ENCOUNTER — Ambulatory Visit: Admit: 2024-06-20 | Discharge: 2024-06-20 | Payer: PRIVATE HEALTH INSURANCE | Attending: Internal Medicine

## 2024-06-20 DIAGNOSIS — I272 Pulmonary hypertension, unspecified: Principal | ICD-10-CM

## 2024-06-20 MED ORDER — potassium chloride CR (Klor-Con M20) 20 mEq ER tablet
20 | ORAL_TABLET | Freq: Every day | ORAL | 0 refills | Status: DC
Start: 2024-06-20 — End: 2024-06-20

## 2024-06-20 MED ORDER — spironolactone (Aldactone) 25 mg tablet
25 | ORAL_TABLET | Freq: Every day | ORAL | 3 refills | 30.00000 days | Status: AC
Start: 2024-06-20 — End: ?

## 2024-06-20 NOTE — Progress Notes (Signed)
 [facility]  [facility]  [address]  [place] [address]  Dept: [phone]    History of Present Illness:  New referral for ?PHTN (edema/abnormal ECHO)  (11/08/19) Alexis [name] is 53Y Female referred to us  for DOE/SOB (suspected PHTN).   She becomes SOB walking a street block, climbing a flight of stairs (needs to recover at the top), carrying things, putting shoes or socks on, and talking on the phone. No SOB getting dressed or showering. For the last 6 months she has noticed she has become more fatigued and her daughter has noticed she has become less active.   Denies DVT/ PE, liver disease. +weight loss medications (no amphetamines. OTC meds only). Sister has RA (Not twins).   +OSA (CPAP 5-7 h nightly for a few months). Sleeping better but level of activity has not improved.   +HTN medications,   Denies DM, CAD, A-fib, HIV, IVDU, cocaine and meth use.  Former smoker quit 35y ago (1 ppd for 16 -20 years).   Experiences swelling in feet and legs also has some moments of dizziness  [name] number: [phone]  Denies fever, cough or exposure to COVID-19  Denies syncope.   No new or changes in medications  We are going to evaluate her for PHTN with RHC  Data  Apparently has ECHO s/o PHTN (we do not have report - just notation in records)    Since last visit, underwent RHC  RHC (12/20): RA 14/11 (9); RV 39/5, 12; PAP 42/18 (27); PCWP 18/18 (15); CO/CI (Fick) 5.94/2.96; CO/CI (TD) 4.90/2.44; PVR 161/195. PCWP increased with fluid challenge (in sum, mild PHTN a/w diastolic dysfunction (risks: age, sex, HTN, weight)    (07/10/20), Alexis [name] reports no change in respiratory status, in ability to perform ADLs and exertion. No change in level of activity. Gained weight during vacation to [place] (205lbs). Before trip to [place], she lost a lot of weight and noticed improved respiratory status and energy. She plans on losing 30lbs.  Denies fever, new cough or  exposure related to COVID (vaccinated-Pfizer, no SE).  Has trace edema, denies pre-syncope and syncope  Medications verified.  Functional Class II    (01/07/21), Alexis [name] reports no change in respiratory status, in ability to perform ADLs and exertion. No change in level of activity. Having difficulty losing weight and has not been very active.   Denies fever, new cough or exposure related to COVID (vaccinated-Pfizer/booster 12/21); + flu shot).  Has edema, denies pre-syncope and syncope  Medications verified.  Functional Class II    (06/04/2021) Alexis [name] reports no change in respiratory status, in ability to perform ADLs and exertion. No change in level of activity. Sometimes has SOB climbing stairs. Some dizziness in the morning (confirmed no DDI). Having difficulty losing weight (in fact, weight gain last few weeks) and has not been very active.   Denies fever, new cough or exposure related to COVID (vaccinated-Pfizer/booster 12/21); encouraged to get 2nd booster; +flu shot.  Has edema, denies pre-syncope and syncope  Medications verified.  Functional Class II    (12/10/21) Alexis [name] reports that her breathing is a little worse than last visit. Has had bronchitis that needed two courses of antibiotics. Had difficulty carrying luggage up stairs on recent flight. Has had multiple CXR ([place] and [place] ; CXR 11/26 reports clear lungs). Even prior to bronchitis she noted breathing is still less than last year and she gets tired  Denies fever, new cough or  exposure related to COVID (vaccinated-Pfizer/booster 12/21); +bivalent booster  +flu shot.  Denies edema, pre-syncope and syncope  Medications verified.  FC  II/III    (07/15/22): Alexis [name] is now Afib (unsure length of time). Prior to the Afib respiratory status was good and she felt fine. BP was slightly elevated today.   Inquired about weight loss drugs.   Reports significant edema in her legs, improves slightly by the morning  Medications  verified    (01/20/23): Alexis [name] reports minimal SOB, but compared to last visit states it is improved.   Diagnosed with Afib (amiodarone; ablation scheduled for May)   Reports edema, has been wearing compression socks with improvement.   Notes that it improves overnight  Also reports tingling in her toes that is associated with the swelling.   Medications verified  ECHO today: Normal LV size, wall thickness, systolic function; LVEF 60%. Diastolic parameters are abnormal. No WMA. RV mild to moderately dilated w/ normal wall thickness and normal systolic function; TAPSE 2.5; TDI S' 16.8. LA normal/RA normal. IVC normal w / >50% respirophasic variation. Lipomatous hypertrophy IAS w/ no ASD. Trace MR. Trace TR w/ normal Rvsp. Mild AR. Trace PR. Borderline dilated ascending aorta. PA normal. No pericardial effusion.    (08/25/23): Alexis [name] reports that her breathing is the same. Gets out of breath easily but recovers well  Underwent ablation  Fell in may (mechanical) and fractured her spine - unclear which vertebrae   Taking gabapentin for back pain   Reports edema, improves overnight if she elevates her feet  Notes that it improves overnight  Also reports tingling in her toes that is associated with the swelling.   Current weight 195lbs  Medication reviewed    (06/20/24): Alexis [name] reports that her breathing is the same. Gets out of breath easily but recovers well  Underwent ablation for AF - still in NSR  [place] in May (mechanical) and fractured spine - unclear which vertebrae   Taking gabapentin for back pain   Reports edema, improves overnight if she elevates her feet  Potassium 3.2 on recent labs  Current weight 197lbs  Medication reviewed    A complete 10-point review of systems was performed. Pertinent positives and negatives are listed in the HPI.   ROS per HPI  Rest ROS neg  Other issues per previous notes.     Review of Systems:  Constitutional: No fever, chills, night sweats.   Eyes: No vision changes.  HENT: No  nasal congestion, runny nose.   Respiratory: As per HPI.   Cardiovascular: No chest pain, palpitations.   Gastrointestinal: No heartburn, reflux.   Neurological: No dizziness.   PH medication side effects: N/A  Rest ROS neg    Past Medical History:   Diagnosis Date    Chronic musculoskeletal pain     GERD (gastroesophageal reflux disease)     Hypertension     Peripheral edema     Right leg pain     SOB (shortness of breath)        No past surgical history on file.    Family History   Problem Relation Name Age of Onset    Rheum arthritis Sister      Other (Cancer Malignancy.) Sister         Social History     Tobacco Use    Smoking status: Not on file    Smokeless tobacco: Not on file   Substance Use Topics    Alcohol use: Defer  Current Outpatient Medications:     alpha tocopherol (Vitamin E) 100 unit capsule, Take 100 Units by mouth 1 (one) time each day., Disp: , Rfl:     amLODIPine (Norvasc) 5 mg tablet, Take by mouth once daily., Disp: , Rfl:     anastrozole (Arimidex) 1 mg tablet, Take 1 mg by mouth in the morning, Disp: , Rfl:     furosemide  (Lasix ) 20 mg tablet, Take 1 tablet (20 mg) by mouth once daily., Disp: 30 tablet, Rfl: 11    gabapentin (Neurontin) 300 mg capsule, Take 300 mg by mouth three times daily., Disp: , Rfl:     multivitamin capsule, Take 1 capsule by mouth in the morning., Disp: , Rfl:     omeprazole (PriLOSEC) 20 mg DR capsule, Take 20 mg by mouth in the morning and at bedtime., Disp: , Rfl:     rivaroxaban (Xarelto) 20 mg tablet, Take 20 mg by mouth once daily. Take with food., Disp: , Rfl:      No Known Allergies     Physical Examination:  Vitals:    06/20/24 0926   BP: (!) 140/83   Pulse: 75   Resp: 16   Temp: 36.2 C (97.2 F)   SpO2: 98%       Wt Readings from Last 6 Encounters:   06/20/24 89.4 kg   01/20/23 88.5 kg   07/15/22 90.4 kg   12/10/21 88.5 kg   01/07/21 92.5 kg   07/10/20 93 kg     Exam  General Appearance: alert and oriented x 3, no apparent distress  HEENT neg  Neck:  Flat JVP  Respiratory: Lungs are clear  Cardiac:  RRR no P2, + murmur   Abd: soft, nontender  Lower extremities: trace edema bilaterally   No C/C  No rashes      Assessment and Plan:  Patient Active Problem List   Diagnosis    Chronic musculoskeletal pain    GERD (gastroesophageal reflux disease)    Hypertension     Peripheral edema    Right leg pain    SOB (shortness of breath)     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 symptoms improved with weight loss but regained weight during vacation. Had planned to lose 30lbs, but has not lost weight so far - also not doing much (exertion/exercise)   Now has developed Afib (controlled w/ amio; s/p ablation)  Emphasized weight loss/ will refer to [facility] as she would qualify for GLP1 for weight loss   K+ low on recent labs and trace edema that does not always go away over night - needs GDMT, will add spironolactone  and send notes to Cardiology for starting SGLT2i  Needs surveillance ECHO   Discussed at length with patient   Send notes to Dr. Lacinda Hamilton and Dr. Nancyann Bracket   Visit 

## 2024-06-24 NOTE — Telephone Encounter (Signed)
 I called to schedule a appointment. I left a voice message with call back number.

## 2024-07-15 NOTE — Telephone Encounter (Addendum)
 Left message on home phone asking patient to please call us  back.  She should have refill available.  [facility] not yet open so I couldn't reach them.    Channing Sensor, RN  07/15/2024  8:43 AM    ADDENDUM: Spokt to Jen at [facility], Patient does not need a new prescription at this time. They will have it ready for pick-up today.  I then left a message on home and mobile that this is all set, along with our number to call back if any questions.    9:34 AM

## 2024-09-01 ENCOUNTER — Ambulatory Visit: Admit: 2024-09-01 | Payer: PRIVATE HEALTH INSURANCE | Attending: Clinical

## 2024-09-01 DIAGNOSIS — I272 Pulmonary hypertension, unspecified: Principal | ICD-10-CM

## 2024-09-01 NOTE — Progress Notes (Signed)
 [facility]  [address]  [address]  [place] [address]  Dept: [phone]  Dept Fax: [phone]    Visit Type: Initial Diagnostic Interview   This evaluation was conducted with both patient and provider in-person in the office setting. Provider met with patient for 60 minutes.    Patient: Alexis [name] Gender:  female    Referred by:  Domenica Neptune, DO DOB:  October 18, 1946   Accompanied by: no one, alone. Age:  78 y.o.     Reason for Visit:  Merial Moritz presents today for an initial evaluation of goals related to weight and wellness    HPI:  Psychiatric History: none reported     Mental Status Evaluation:  Cognition:   grossly intact  Sensorium:   person, place, time/date, and situation  Appearance:   age appropriate and casually dressed  Behavior:   normal  Speech:   normal pitch and normal volume  Mood:    normal  Affect:    normal  Thought Process:  normal  Thought Content:  normal  Insight:    Intact  Judgment:   Intact    Initial Behavioral Health Evaluation:  Patient is a 78 year old female who presents to [facility] for initial behavioral health evaluation.  Interested Track: Medical Weight Loss  Diagnosis:     Weight History & Motivating Factors  Onset of weight challenges/Contributing factors: pt shared I was raised very healthy in  [place], started about 55yrs ago when she broke her leg in 2021- was in w/c, crutches, took 41yr to get back to normalcy; has had other health issues since; said it's really hard to lose weight at my age, reports weight is mostly in mid-section  Physical/Psychological complications of weight: wants to get back to lifelong healthy weight and feel better  Previous weight loss attempts: [employer] in the past in-person however no longer in-person only online, I like to be in a group, motivation  Motivating factors for bariatric surgery/weight loss: get back to lifelong healthy weight and feel better overall   Health: In Spring 2024- fell, injured her  back and had surgery as result; had severe break in R leg with bar in it ~2021 took 1 yr to get back to normal; afib; pulm hypertension; stenosis; poor balance- uses cane; Hx hormone-based breast cancer in 2022; said she takes on a pill that draws calcium from her bones; 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 When asked if she's open to meeting with RD pt said I would like that, I'm open for anything.  Binge eating: No; Purging: No; Overly restrictive eating: No; Emotional eating:  No; Eating out of boredom: No    Water intake: not enough  Caffeine use: Yes - Comment: 1-2 cups coffee in AM- just 1% milk, no sugar  Carbonated drinks: Yes - Comment: if I drink it is would diet or seltzer water    Alcohol use: Yes - Comment: 1-2 glasses of wine, 2-3x week  Nicotine use: No  Marijuana use: No    Other Lifestyle Factors  Physical activities: I've always been an active person however spoke of injuries to leg and back since 2021 which has limited her; does  some aqua aerobics  Work/School schedule: Retired arboriculturist; 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 Pt has lived in [place] for 51yrs. Shared they had a vacation place in [place]  which as of 202 is now her primary residence. They they live in [facility] for January-May.  Pt spoke of growing up  active and very healthy on a farm. Pt began having some health issues as of 2021 (broke leg) followed by a back injury, breast cancer etc. Pt denies experiencing any anxiety, mood or other behavioral health conditions sharing I'm feeling fine and I try to keep myself busy with family, freelance decorating, travel, socializing etc. Pt said she just visited [place] for 3 weeks adding connection, it's important.   Psychotherapy: denies need; Psychotropic medications: denies need    Assessment/Plan:  Patient is a 78 year old female who is interested in the medical weight loss program. Pt is scheduled to meet with [facility] 01/09/25 however since she is expecting to be in [place] Day she will reschedule. Pt agrees to RD visit therefore provider escorted pt to [facility] team for scheduling at end of visit. .     Evaluation of the patients ability and capacity to respond to treatment yes, patient appears capable of working toward and meeting her desired goals    Safety Assessment: Regarding safety, the patient has had thoughts of harm to self or others?   No   The patient has risk factors that include caucasian ethnicity  and advanced age , none. Protective factors include access to psychiatric care , strong community supports, future orientation, and help-seeking behavior .  In considering these factors, the patient does not seem an acute risk for intentional harm to self or others.    Disposition/Follow Up: Patient declined need for f/up BH visit  Total Time Spent:  60 minutes    Will communicate plan with Care Team.     Electronic signature:  Avonda Toso, LICSW  [facility]

## 2024-10-25 NOTE — Telephone Encounter (Signed)
 I called patient left msg. We have sooner available appointments at our office in November. I can call patient to schedule if they are interested.

## 2024-10-28 NOTE — Telephone Encounter (Signed)
 Spoke with patient, r/s to 12/9. Pt all set.

## 2024-10-28 NOTE — Telephone Encounter (Signed)
 Left vm with patient to reschedule appointment.

## 2024-10-28 NOTE — Telephone Encounter (Signed)
 Seraphine c/b [phone]     Returning call for sooner appt

## 2024-11-01 NOTE — BH OP Treatment Plan (Signed)
 OUTPATIENT TREATMENT PLAN  [address] [facility]  [facility]  [address]  [place] [address]  [phone]      Patient:  Alexis [name]  DOB:  July 20, 1946    Providers on Multidisciplinary Team:   Patient Care Team:  Margrette Phlegm, MD as Primary Care Provider (Pulmonary Disease)    Diagnosis:   1. Pulmonary hypertension (Multi-HCC)        Current Medications:   Current Outpatient Medications   Medication Instructions    alpha tocopherol (VITAMIN E) 100 Units, oral, Daily    amLODIPine (Norvasc) 5 mg tablet oral, Daily    anastrozole (ARIMIDEX) 1 mg, oral, Daily    furosemide  (LASIX ) 20 mg, oral, Daily    gabapentin (NEURONTIN) 300 mg, oral, 3 times daily    multivitamin capsule 1 capsule, oral, Daily    omeprazole (PRILOSEC) 20 mg, oral, 2 times daily    rivaroxaban (XARELTO) 20 mg, oral, Daily, Take with food.    spironolactone  (ALDACTONE ) 25 mg, oral, Daily       Patient's SNAP Assessment: Patient's SNAP Assessment:    Strengths:  Customer Service Manager, Stable Housing, Ability to sempra energy with others, Ability to [employer] relationships, Motivation for Treatment, and Future Oriented & Goal-Directed   Needs:  Psychoeducation, Healthy Lifestyle Education, Copywriter, Advertising   Abilities: Motivated for Treatment, Ability to Learn, Ability to Read & Write , Engineer, Drilling) Knowledge & Skills , and Positive Goals and Plans for Future    Preferences: telehealth visits    Treatment Goals:    (Use SMART Goals: Specific, Measurable, Attainable, Relevant, Time bound)        Goal 1:  Patient will work to increase daily water intake to meet the recommendation of at least 64oz daily (pt to discuss with their MD about specific individual needs)    Target Date (within 6 months): 03/02/2025  Intervention    Type:  Individual  Therapy     Frequency:  Intermittantly    Duration: 30 minutes  Responsible Clinician: Katisha Shimizu, LICSW     Goal 2: Patient will  work to add/increase exercise to her routine as able to meet the weekly recommendation for at least 150/300 minutes of moderate intensity cardio and 2 sessions of strength training   Target Date (within 6 months): 03/02/2025  Intervention    Type:  Individual  Therapy     Frequency:  Intermittantly    Duration: 30 minutes  Responsible Clinician: Roston Grunewald, LICSW     Goal 3: Patient will focus on creating nutrient dense and consistent nutrition habits according to RD recommendations that align with their wellness and weight goals.    Target Date (within 6 months):  03/02/2025   Intervention    Type:  Individual  Therapy     Frequency:  Intermittantly    Duration: 30 minutes  Responsible Clinician: Cavon Nicolls, LICSW      Attestation  I certify that the patient and members of the treatment team participated in the formulation of this treatment plan. I certify that the treatment plan is medically necessary and has been prescribed by me for the treatment of the identified problems, to improve or maintain functioning, and to prevent relapse and hospitalization.

## 2024-11-22 ENCOUNTER — Ambulatory Visit: Admit: 2024-11-22 | Payer: PRIVATE HEALTH INSURANCE | Attending: Physician Assistant

## 2024-11-22 MED ORDER — ZEPBOUND 2.5 MG/0.5 ML SUBCUTANEOUS PEN INJECTOR
2.5 | SUBCUTANEOUS | 3 refills | 28.00000 days | Status: AC
Start: 2024-11-22 — End: 2025-11-22

## 2024-11-22 MED ORDER — ALCOHOL SWABS
TOPICAL | 11 refills | 30.00000 days | Status: AC
Start: 2024-11-22 — End: ?

## 2024-11-22 MED ORDER — SHARPS CONTAINER
11 refills | Status: AC
Start: 2024-11-22 — End: ?

## 2024-11-22 NOTE — Progress Notes (Addendum)
 [facility]  [facility]  [address]  [address]  [place] [address]  Dept Phone: [phone]  Dept Fax: [phone]     Initial Consultation    Subjective   Patient ID: Alexis [name] is a 78 y.o. female affected by obesity, presenting today for a consultation regarding non-surgical weight management.     HPI  Alexis [name] is a 78 y/o female with a PMH of GERD, OSA, A. Fib, pulmonary HTN, chronic pain and peripheral edema.    The patient's obesity increased over the last 5-6 yrs after fracturing her leg.  Their first attempted diet program was done at the age of 43.  Over the years, they tried the following diet systems: Weight Watchers and Self-managed . They initially lost weight using some of these methods, but over time, they regained all of it along with additional weight, with this cycle repeating itself.    Her main goals are to achieve significant weight loss, lower their risk of and manage comorbid conditions, enhance energy levels, improve mobility, and restore overall quality of life.    The patient is a sweet eater , is a sweet drinker , drinks over 2 cups of coffee daily, drinks at least 1 can of soda daily, and is a snacker/grazer on sweets .  She denies skipping meals, denies problems with portion control, denies frequent takeout/fastfood consumption, denies eating late at night, loves carbohydrates , is not a stress eater, and has never been diagnosed with an eating disorder like bulimia or anorexia .    Denies personal or family history of medullary thyroid cancer or MEN2 syndrome. No history of acute pancreatitis.  No active gallbladder disease. No hypoglycemia and not on insulin or sulfonylureas. No kidney or liver disease, and no history of acute kidney injury (AKI). No tachycardia. No diabetic retinopathy, glaucoma, or macular degeneration. No gastroparesis or severe gastrointestinal disease. No seizure disorder. No excessive ETOH. No  current or past opioid use or abuse. No suicidal ideation or behavior. No past or present diagnosis of eating disorder. No previous bariatric surgical procedures. Denies the chance of pregnancy or plans for pregnancy. Is not breastfeeding.      Birth control: Postmenopausal    Has this patient participated in a comprehensive weight management program that encourages behavioral modification, reduced calorie diet, and increased physical activity with continuing follow-up for at least 6 months prior to using drug therapy? Yes.    Does the patient have a body mass index (BMI) greater than or equal to 30kg/m2 OR 27kg/m2  AND/ OR at least one weight-related comorbid condition? Yes, hypertension, acid reflux, and osteoarthritis/joint pain    Will potential medications be used in adjunct to a comprehensive weight management plan, including a reduced-calorie diet, increased physical activity and behavioral modifications? Yes.    RECALL  Breakfast: toast, coffee and orange   Lunch: sandwich   Dinner: pork chop, rice and salad  Snack(s):  Hydration: apple juice, not great with drinking liquids   Exercise: no exercise since back surgery in April 2023  Sleep: sleeps okay and generally feels well rested   Stress: not stressed     PROBLEM LIST  Problem List[1]    PAST MEDICAL HISTORY  Medical History[2]     SURGICAL HISTORY  Surgical History[3]     MEDICATIONS  Current Medications[4]     ALLERGIES  Allergies[5]     FAMILY HISTORY  Family History[6]  No  family hx of medullary thyroid cancer or MEN2    SOCIAL HISTORY  Social History[7]    REVIEW OF SYSTEMS  Review of Systems   Constitutional: Negative.    HENT: Negative.     Eyes: Negative.    Respiratory: Negative.     Cardiovascular: Negative.    Gastrointestinal:  Positive for heartburn.   Genitourinary: Negative.    Musculoskeletal:  Positive for back pain.   Skin: Negative.    Neurological:  Positive for sensory change (has neuropathy of the right leg).    Psychiatric/Behavioral: Negative.       VITALS  Visit Vitals  BP (!) 147/87 (BP Location: Left arm, Patient Position: Sitting, BP Cuff Size: Large adult)   Pulse 80   Ht 1.689 m   Wt 89.2 kg   BMI 31.27 kg/m   BSA 2.05 m      Objective   WEIGHT  Wt Readings from Last 10 Encounters:   11/22/24 89.2 kg   09/01/24 89.2 kg   06/20/24 89.4 kg   01/20/23 88.5 kg   07/15/22 90.4 kg   12/10/21 88.5 kg   01/07/21 92.5 kg   07/10/20 93 kg   11/08/19 98.9 kg     BMI: Body mass index is 31.27 kg/m.  BSA: Estimated body surface area is 2.05 meters squared as calculated from the following:    Height as of this encounter: 1.689 m.    Weight as of this encounter: 89.2 kg.    InBody:  Weight: 196.7 lb; BMI: 31.3  Body fat mass: 81.8 lb (41.6%); SMM: 62.2 lb  Segmental lean mass: arms 101-105%, legs 90-109%, trunk 96%  Assessment: Obesity with moderately elevated body fat percentage and overall adequate lean mass distribution.    PHYSICAL EXAM  Vitals reviewed.   General: NAD, AOx3  HENT: NC, AT, EOMI, PERLA.  No thyromegaly noted, no thyroid nodules appreciated.  Cardiovascular: Normal rate and regular rhythm. Normal pulses.   Pulmonary: non labored with normal breath sounds   Abdominal: obese, normal bowel sounds, soft, nontender, no palpable hernias  Musculoskeletal: No dorsocervical fat pad, range of motion within normal limits, no lipedema.   Skin: skin is warm and dry/ No acne, hirsutism, acanthosis nigricans, striae, skin tags, stasis dermatitis and ulcerations, intertrigo noted.   Psychiatric: normal mood and affect     ASSESSMENT/PLAN  Assessment/Plan   Diagnoses and all orders for this visit:  Obesity, Class I, BMI 30-34.9  -     Lipid panel; Future  -     Hemoglobin A1c; Future  -     Vit D 25 hydroxy; Future  -     CBC; Future  -     TSH with reflex; Future  -     Comprehensive metabolic panel; Future  -     alcohol  swabs  (Alcohol  Wipes) pads, medicated; Apply 1 each topically 1 (one) time per week. use as directed  with injection  -     tirzepatide , weight loss, (Zepbound ) 2.5 mg/0.5 mL syringe; Inject 0.5 mL (2.5 mg) under the skin 1 (one) time per week.  -     Transport Planner; Dispose of used pen/needle as directed  Gastroesophageal reflux disease, unspecified whether esophagitis present  Primary hypertension  Dietary counseling  Encounter for exercise counseling  Weight gain  -     TSH with reflex; Future  BMI 31.0-31.9,adult    Alexis [name] is a 78 y.o. female affected by obesity who was seen today for a  consultation regarding non-surgical weight management.  Vital signs were reviewed and found to be stable, Body mass index is 31.27 kg/m., Obesity Class 1: BMI 30.0-34.9. We thoroughly discussed the details of their initial patient assessment, including FDA-approved medication options for weight management. We also reviewed general treatment principles, including the pros and cons, risks and benefits, and current limitations of available evidence. Taking into account the patient's risk tolerance, personal perspectives, and philosophies, we discussed their preferred approach moving forward.      We will initiate Zepbound  2.5mg  SQ weekly for 4 weeks. If tolerated, increase to 5 mg weekly. If side effects persist after 4 weeks, advised to continue 2.5 mg for an additional 4 weeks before increasing the dose.       If she tolerates the medication well after a month, the dose can be increased. However, if she experiences significant side effects, the current dose will be maintained until she acclimates to it. If the medication proves intolerable, it can be discontinued.  No contraindications to therapy reported.  Medication education was provided, patient verbalized understanding.  Handouts provided for reinforcement of content.        A comprehensive discussion was held regarding the potential side effects of the weight loss medication, including nausea, vomiting, constipation, diarrhea, stomach upset, delayed gastric emptying,  and rare inflammatory eye conditions that could lead to vision loss or blindness. She was advised to limit alcohol  consumption due to the risk of pancreatitis associated with the medication. The importance of annual eye check-ups was emphasized. She was instructed to discontinue the medication and contact the office immediately if she experiences any changes in vision.     The medication will be initiated at the lowest possible dose, with plans to gradually increase it monthly until her weight loss goal is achieved. She was informed that long-term use of these medications is necessary to maintain weight loss, as discontinuation could lead to weight regain. The prescription will be sent to her pharmacy, and it is expected to take approximately 2 weeks for insurance authorization. If her insurance does not cover incretin mimetic, we discussed metformin as an alternative.  She is going to [place]  for the winter and not comfortable doing virtual visits t/f will not be able to follow up in person until she returns in May.     The patient will perform baseline blood work and diagnostics, as ordered/appropriate.    We reviewed the patients lifestyle modification and I reiterated the importance of adequate fluid and protein intake, as well as resistance training on a consistent schedule. We discussed how each of these serve to maximize nutrition, stabilize blood glucose, maintain muscle mass, optimize metabolism and minimize hair loss. The patient understands that these practices are necessary to decrease the possibility or severity of side effects and maximize their health outcomes.     Nutrition: Discussed the importance of a reduced-calorie, balanced diet with an emphasis on lean proteins aiming for 60-80 g/d, fiber-rich foods, healthy fats, and adequate hydration aiming for 64 oz. H2O. Advised limiting processed foods and refined carbohydrates, practicing portion control, and mindful eating to enhance satiety. The  patient will be referred to nutrition therapy and behavioral health, as appropriate, for counseling.     Behavior: The patient was advised to focus on behavioral modifications, including stress management techniques and maintaining good sleep hygiene.     Exercise: Bioelectrical impedance analysis (BIA) was performed today to assess fat mass and lean body mass changes, hydration status, response to medical  weight management, and risk of sarcopenia. These metrics are essential for guiding the patients treatment plan, including adjustments to medications, nutrition, exercise, and fluid management. Given the patients diagnosis of obesity, ongoing monitoring of body composition will help optimize health outcomes and mitigate the risk of muscle loss. Based on these results, the patient was provided with an exercise prescription encouraging regular physical activity, aiming for at least 150-300 minutes of moderate-intensity aerobic activity, such as brisk walking, each week, or 75-150 minutes of vigorous activity, like running. Advised to break workouts into sessions of 30-60 minutes per day.  Additionally, they were advised to incorporate or continue resistance training at least 2-3 times per week to preserve muscle mass and strength, with a referral to a physical therapist or exercise specialist if needed. Protein intake should be optimized to 1.2-1.6 g/kg of body weight per day, emphasizing high-quality protein sources, with supplementation considered if dietary intake is inadequate. Nutritional support will ensure adequate intake of macro- and micronutrients, with guidance from a registered dietitian as appropriate. Medications were adjusted based on todays findings. The patient will continue close follow-up with periodic body composition assessments to monitor progress and adjust interventions as needed.      Follow-Up: May    Anagha Loseke, MHS, PA-C  [facility]     Total time  spent was 60 minutes. This includes the time spent preparing to see the patient (reviewing tests and medical records), performing medically appropriate examination and/or evaluation, with >50% on counseling and care coordination counseling, prescription drug management and documenting in the EMR.    CC: No PCP, Per Patient         [1]   Patient Active Problem List  Diagnosis    Chronic musculoskeletal pain    GERD (gastroesophageal reflux disease)    Hypertension    Peripheral edema    Right leg pain    SOB (shortness of breath)   [2]   Past Medical History:  Diagnosis Date    Atrial fibrillation     Chronic musculoskeletal pain     Esophageal stricture     GERD (gastroesophageal reflux disease)     Hypertension     Peripheral edema     Right leg pain     SOB (shortness of breath)    [3]   Past Surgical History:  Procedure Laterality Date    CESAREAN SECTION, CLASSIC      LEG LOWER RIGHT (TIB/FIB)     [4]   Current Outpatient Medications:     alpha tocopherol (Vitamin E) 100 unit capsule, Take 100 Units by mouth 1 (one) time each day., Disp: , Rfl:     amLODIPine (Norvasc) 5 mg tablet, Take by mouth once daily., Disp: , Rfl:     anastrozole (Arimidex) 1 mg tablet, Take 1 mg by mouth in the morning, Disp: , Rfl:     furosemide  (Lasix ) 20 mg tablet, Take 1 tablet (20 mg) by mouth once daily., Disp: 30 tablet, Rfl: 11    gabapentin (Neurontin) 300 mg capsule, Take 300 mg by mouth three times daily., Disp: , Rfl:     multivitamin capsule, Take 1 capsule by mouth in the morning., Disp: , Rfl:     omeprazole (PriLOSEC) 20 mg DR capsule, Take 20 mg by mouth in the morning and at bedtime., Disp: , Rfl:     rivaroxaban (Xarelto) 20 mg tablet, Take 20 mg by mouth once daily. Take with food., Disp: , Rfl:  spironolactone  (Aldactone ) 25 mg tablet, TAKE 1 TABLET(25 MG) BY MOUTH DAILY, Disp: 90 tablet, Rfl: 3  [5] No Known Allergies  [6]   Family History  Problem Relation Name Age of Onset    Rheum arthritis Sister      Other  (Cancer Malignancy.) Sister     [7]   Social History  Tobacco Use    Smoking status: Never   Substance Use Topics    Alcohol  use: Defer    Drug use: Defer

## 2024-11-23 LAB — LIPID PANEL
Cholesterol: 223 mg/dL — ABNORMAL HIGH (ref 100–199)
HDL Cholesterol: 32 mg/dL — ABNORMAL LOW (ref >39)
LDLc Calc (NIH): 164 mg/dL — ABNORMAL HIGH (ref 0–99)
Non-HDL Chol: 191 mg/dL — ABNORMAL HIGH (ref 0–129)
Triglycerides: 149 mg/dL (ref 0–149)
VLDLc Calc: 27 mg/dL (ref 5–40)

## 2024-11-23 LAB — COMPREHENSIVE METABOLIC PANEL
ALT: 17 IU/L (ref 0–32)
AST: 20 IU/L (ref 0–40)
Albumin: 4.1 g/dL (ref 3.8–4.8)
Alk Phosphatase: 99 IU/L (ref 49–135)
Anion Gap: 10 mmol/L (ref 10.0–18.0)
BUN/Creat Ratio: 17 (ref 12–28)
BUN: 15 mg/dL (ref 8–27)
Bili Total: 0.4 mg/dL (ref 0.0–1.2)
Calcium: 9.4 mg/dL (ref 8.7–10.3)
Carbon Dioxide: 24 mmol/L (ref 20–29)
Chloride: 106 mmol/L (ref 96–106)
Creat: 0.88 mg/dL (ref 0.57–1.00)
Globulin Total: 2 g/dL (ref 1.5–4.5)
Glucose: 92 mg/dL (ref 70–99)
Potassium: 5 mmol/L (ref 3.5–5.2)
Protein Total: 6.1 g/dL (ref 6.0–8.5)
Sodium: 140 mmol/L (ref 134–144)
eGFR: 67 mL/min/1.73 (ref >59)

## 2024-11-23 LAB — CBC
Hct: 40.8 % (ref 34.0–46.6)
Hgb: 13.9 g/dL (ref 11.1–15.9)
MCH: 30.6 pg (ref 26.6–33.0)
MCHC: 34.1 g/dL (ref 31.5–35.7)
MCV: 90 fL (ref 79–97)
Platelets: 283 x10E3/uL (ref 150–450)
RBC: 4.54 x10E6/uL (ref 3.77–5.28)
RDW: 13 % (ref 11.7–15.4)
WBC: 7.7 x10E3/uL (ref 3.4–10.8)

## 2024-11-23 LAB — TSH WITH REFLEX: TSH: 1.52 u[IU]/mL (ref 0.450–4.500)

## 2024-11-23 LAB — HEMOGLOBIN A1C: HgbA1C: 5.5 % (ref 4.8–5.6)

## 2024-11-23 LAB — VITAMIN D 25 HYDROXY: Vitamin D, 25-Hydroxy: 30.7 ng/mL (ref 30.0–100.0)

## 2024-12-20 ENCOUNTER — Ambulatory Visit: Admit: 2024-12-20 | Discharge: 2024-12-20 | Payer: PRIVATE HEALTH INSURANCE | Attending: Internal Medicine

## 2024-12-20 DIAGNOSIS — I272 Pulmonary hypertension, unspecified: Secondary | ICD-10-CM

## 2024-12-20 DIAGNOSIS — I5030 Unspecified diastolic (congestive) heart failure: Principal | ICD-10-CM

## 2024-12-20 MED ORDER — DAPAGLIFLOZIN PROPANEDIOL 10 MG TABLET
10 | ORAL_TABLET | Freq: Every day | ORAL | 3 refills | 30.00000 days | Status: AC
Start: 2024-12-20 — End: ?

## 2024-12-20 NOTE — Progress Notes (Signed)
 [facility]  [facility]  [address]  [place] [address]  Dept: [phone]    History of Present Illness:  Referral for ?PHTN (edema/abnormal ECHO)  (11/08/19) Ms. Alexis [name] is 9Y Female referred to us  for DOE/SOB (suspected PHTN).   She becomes SOB walking a street block, climbing a flight of stairs (needs to recover at the top), carrying things, putting shoes or socks on, and talking on the phone. No SOB getting dressed or showering. For the last 6 months she has noticed she has become more fatigued and her daughter has noticed she has become less active.   Denies DVT/ PE, liver disease. +weight loss medications (no amphetamines. OTC meds only). Sister has RA (Not twins).   +OSA (CPAP 5-7 h nightly for a few months). Sleeping better but level of activity has not improved.   +HTN medications,   Denies DM, CAD, A-fib, HIV, IVDU, cocaine and meth use.  Former smoker quit 35y ago (1 ppd for 16 -20 years).   Experiences swelling in feet and legs also has some moments of dizziness  Daughter: Alexis [name] Phone number: [phone]  Denies fever, cough or exposure to COVID-19  Denies syncope.   No new or changes in medications  We are going to evaluate her for PHTN with RHC  Data  Apparently has ECHO s/o PHTN (we do not have report - just notation in records)    Since last visit, underwent RHC  RHC (12/20): RA 14/11 (9); RV 39/5, 12; PAP 42/18 (27); PCWP 18/18 (15); CO/CI (Fick) 5.94/2.96; CO/CI (TD) 4.90/2.44; PVR 161/195. PCWP increased with fluid challenge (in sum, mild PHTN a/w diastolic dysfunction (risks: age, sex, HTN, weight)    (07/10/20), Ms. Alexis [name] reports no change in respiratory status, in ability to perform ADLs and exertion. No change in level of activity. Gained weight during vacation to [place] (205lbs). Before trip to [place], she lost a lot of weight and noticed improved respiratory status and energy. She plans on losing 30lbs.  Denies fever, new cough or  exposure related to COVID (vaccinated-Pfizer, no SE).  Has trace edema, denies pre-syncope and syncope  Medications verified.  Functional Class II    (01/07/21), Ms. Alexis [name] reports no change in respiratory status, in ability to perform ADLs and exertion. No change in level of activity. Having difficulty losing weight and has not been very active.   Denies fever, new cough or exposure related to COVID (vaccinated-Pfizer/booster 12/21); + flu shot).  Has edema, denies pre-syncope and syncope  Medications verified.  Functional Class II    (06/04/2021) Ms. Alexis [name] reports no change in respiratory status, in ability to perform ADLs and exertion. No change in level of activity. Sometimes has SOB climbing stairs. Some dizziness in the morning (confirmed no DDI). Having difficulty losing weight (in fact, weight gain last few weeks) and has not been very active.   Denies fever, new cough or exposure related to COVID (vaccinated-Pfizer/booster 12/21); encouraged to get 2nd booster; +flu shot.  Has edema, denies pre-syncope and syncope  Medications verified.  Functional Class II    (12/10/21) Ms. Alexis [name] reports that her breathing is a little worse than last visit. Has had bronchitis that needed two courses of antibiotics. Had difficulty carrying luggage up stairs on recent flight. Has had multiple CXR ([place] and [place] ; CXR 11/26 reports clear lungs). Even prior to bronchitis she noted breathing is still less than last year and she gets tired  Denies fever, new cough or exposure  related to COVID (vaccinated-Pfizer/booster 12/21); +bivalent booster  +flu shot.  Denies edema, pre-syncope and syncope  Medications verified.  FC  II/III    (07/15/22): Ms. Alexis [name] is now Afib (unsure length of time). Prior to the Afib respiratory status was good and she felt fine. BP was slightly elevated today.   Inquired about weight loss drugs.   Reports significant edema in her legs, improves slightly by the morning  Medications  verified    (01/20/23): Ms. Alexis [name] reports minimal SOB, but compared to last visit states it is improved.   Diagnosed with Afib (amiodarone; ablation scheduled for May)   Reports edema, has been wearing compression socks with improvement.   Notes that it improves overnight  Also reports tingling in her toes that is associated with the swelling.   Medications verified  ECHO today: Normal LV size, wall thickness, systolic function; LVEF 60%. Diastolic parameters are abnormal. No WMA. RV mild to moderately dilated w/ normal wall thickness and normal systolic function; TAPSE 2.5; TDI S' 16.8. LA normal/RA normal. IVC normal w / >50% respirophasic variation. Lipomatous hypertrophy IAS w/ no ASD. Trace MR. Trace TR w/ normal Rvsp. Mild AR. Trace PR. Borderline dilated ascending aorta. PA normal. No pericardial effusion.    (08/25/23): Ms. Alexis [name] reports that her breathing is the same. Gets out of breath easily but recovers well  Underwent ablation  Fell in may (mechanical) and fractured her spine - unclear which vertebrae   Taking gabapentin for back pain   Reports edema, improves overnight if she elevates her feet  Notes that it improves overnight  Also reports tingling in her toes that is associated with the swelling.   Current weight 195lbs  Medication reviewed    (06/20/24): Ms. Alexis [name] reports that her breathing is the same. Gets out of breath easily but recovers well  Underwent ablation for AF - still in NSR  [place] in May (mechanical) and fractured spine - unclear which vertebrae   Taking gabapentin for back pain   Reports edema, improves overnight if she elevates her feet  Potassium 3.2 on recent labs  Current weight 197lbs  Medication reviewed    12/20/2024: Ms. Alexis [name] has seen weight and wellness clinic for weight loss. Had planned for GLP1, though insurance won't cover it [facility] and Zepbound  both declined).    Weight today 190lbs (previous 197lbs)  No edema.  Follows with Dr. Glendia EP at [facility] - s/p afib ablation  04/2024.  On Xarelto.  No known recurrence.  General Cardiologist is in [place]  where she spends half of the year.  Since last visit - notes feeling winded, though recovers quickly.  Needing to rest often due to dyspnea.  Tolerating Xarelto  Meds reviewed.      A complete 10-point review of systems was performed. Pertinent positives and negatives are listed in the HPI.   ROS per HPI  Rest ROS neg  Other issues per previous notes.     Past Medical History:   Diagnosis Date    Atrial fibrillation     Chronic musculoskeletal pain     Esophageal stricture     GERD (gastroesophageal reflux disease)     Hypertension     Lyme disease     Peripheral edema     Pulmonary hypertension     Right leg pain     SOB (shortness of breath)        Past Surgical History:   Procedure Laterality Date    CESAREAN SECTION, CLASSIC  FIXATION KYPHOPLASTY      LEG LOWER RIGHT (TIB/FIB)         Family History   Problem Relation Name Age of Onset    Rheum arthritis Sister      Other (Cancer Malignancy.) Sister       Social History     Tobacco Use    Smoking status: Never    Smokeless tobacco: Not on file   Substance Use Topics    Alcohol  use: Defer       Current Outpatient Medications:     alcohol  swabs  (Alcohol  Wipes) pads, medicated, Apply 1 each topically 1 (one) time per week. use as directed with injection, Disp: 50 each, Rfl: 11    alpha tocopherol (Vitamin E) 100 unit capsule, Take 100 Units by mouth 1 (one) time each day., Disp: , Rfl:     amLODIPine (Norvasc) 5 mg tablet, Take by mouth once daily., Disp: , Rfl:     anastrozole (Arimidex) 1 mg tablet, Take 1 mg by mouth in the morning, Disp: , Rfl:     baclofen (Lioresal) 5 mg tablet, Take 5 mg by mouth if needed in the morning, at noon, and at bedtime., Disp: , Rfl:     furosemide  (Lasix ) 20 mg tablet, Take 1 tablet (20 mg) by mouth once daily., Disp: 30 tablet, Rfl: 11    methocarbamol (Robaxin) 500 mg tablet, Take 500 mg by mouth three times daily., Disp: , Rfl:     multivitamin  capsule, Take 1 capsule by mouth in the morning., Disp: , Rfl:     omeprazole (PriLOSEC) 20 mg DR capsule, Take 20 mg by mouth in the morning and at bedtime., Disp: , Rfl:     pregabalin (Lyrica) 50 mg capsule, Take 50 mg by mouth three times daily., Disp: , Rfl:     rivaroxaban (Xarelto) 20 mg tablet, Take 20 mg by mouth once daily. Take with food., Disp: , Rfl:     Transport Planner, Dispose of used pen/needle as directed, Disp: 1 each, Rfl: 11    spironolactone  (Aldactone ) 25 mg tablet, TAKE 1 TABLET(25 MG) BY MOUTH DAILY, Disp: 90 tablet, Rfl: 3    tirzepatide , weight loss, (Zepbound ) 2.5 mg/0.5 mL syringe, Inject 0.5 mL (2.5 mg) under the skin 1 (one) time per week., Disp: 2 mL, Rfl: 3     No Known Allergies     Physical Examination:  Vitals:    12/20/24 1105   BP: (!) 142/85   Pulse: 73   Resp: 16   Temp: 36.8 C (98.2 F)   SpO2: 98%       Wt Readings from Last 6 Encounters:   12/20/24 86.2 kg (190 lb)   11/22/24 89.2 kg (196 lb 11.2 oz)   09/01/24 89.2 kg (196 lb 9.6 oz)   06/20/24 89.4 kg (197 lb)   01/20/23 88.5 kg (195 lb)   07/15/22 90.4 kg (199 lb 6.4 oz)     Exam  General Appearance: alert and oriented x 3, no apparent distress  HEENT neg  Neck: Flat JVP  Respiratory: Lungs are clear  Cardiac:  RRR, no murmurs  Abd: soft, nontender  Lower extremities: non edematous  No C/C  No rashes    Assessment and Plan:  Patient Active Problem List   Diagnosis    Chronic musculoskeletal pain    GERD (gastroesophageal reflux disease)    Hypertension    Peripheral edema    Right leg pain    SOB (shortness  of breath)     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 symptoms improved with weight loss but regained weight during vacation. Weight ongoing issue - working with [employer] [facility]; re: GLP1RA (Has  OSA).    Atrial fibrillation s/p ablation in May 2025 in [place] .  General Cardiologist in [place]  - no longer sees Dr. Nancyann Bracket.    Had TTE at Dr. Celina office last Spring - she will bring us  information to request records.  If unable then will repeat here.  RA started last visit for HFpEF.  Should be on SGLT2i as well - will add today.  Cr 0.8 last month.    Seen with Dr. Missy.  Vernell Eagles, MD  Cardiology Fellow

## 2024-12-20 NOTE — Progress Notes (Signed)
 Pt stopped by the office today re Zepbound  denial.  Pt has appt with sleep study MD next week. Has not had a sleep study in years and study was done in [place] .  No report on file  nor mention of degree of OSA.      Recommended pt obtain us  a copy of study and we can try to resubmit for approval.    Discussed other options such as metformin with pt.     Franki Alcaide, MHS, PA-C  Phone: [phone]  Fax: [phone]  Email: [email or web address]  [facility] [place]  [address]  [address]  [place], [place] [address]

## 2024-12-26 NOTE — Telephone Encounter (Signed)
 Spoke with [name] [phone]). Reports that she was informed by another clinician that she can obtain coverage for sleep apnea, however her degree is mild. Has pulmonary HTN, trying to lose weight due to this. Discussed orals for off label use, would be interested in looking into some of them on her own, and then scheduling a sooner follow up if interested in any of them. This RN to send message naming a few off label oral options.    Alexis JINNY Duhamel, RN 12/26/2024

## 2024-12-26 NOTE — Telephone Encounter (Signed)
 Hello,  Patient requesting call back from: Danielle         In regards to: To FU on Sleep apnea      Call back number is: [phone]  Was the office contacted?    Relayed to patient the team will get back to them as soon as they are available.    Thank you!

## 2024-12-27 ENCOUNTER — Ambulatory Visit: Payer: PRIVATE HEALTH INSURANCE | Attending: Registered"
